# Patient Record
Sex: Female | Born: 2000 | Race: Black or African American | Hispanic: No
Health system: Southern US, Community
[De-identification: ages and names within clinical notes are randomized; demographics above are authoritative.]

## PROBLEM LIST (undated history)

## (undated) DIAGNOSIS — Q6119 Other polycystic kidney, infantile type: Secondary | ICD-10-CM

## (undated) DIAGNOSIS — N83209 Unspecified ovarian cyst, unspecified side: Secondary | ICD-10-CM

## (undated) DIAGNOSIS — N289 Disorder of kidney and ureter, unspecified: Secondary | ICD-10-CM

## (undated) HISTORY — DX: Disorder of kidney and ureter, unspecified: N28.9

---
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
## 2021-01-21 ENCOUNTER — Emergency Department (HOSPITAL_COMMUNITY): Payer: BC Managed Care – PPO

## 2021-01-21 ENCOUNTER — Telehealth: Payer: Self-pay | Admitting: *Deleted

## 2021-01-21 ENCOUNTER — Encounter (HOSPITAL_COMMUNITY): Payer: Self-pay | Admitting: Emergency Medicine

## 2021-01-21 ENCOUNTER — Other Ambulatory Visit: Payer: Self-pay

## 2021-01-21 ENCOUNTER — Emergency Department (HOSPITAL_COMMUNITY)
Admission: EM | Admit: 2021-01-21 | Discharge: 2021-01-21 | Disposition: A | Discharge status: Home / Self Care | Payer: BC Managed Care – PPO | Attending: Emergency Medicine | Admitting: Emergency Medicine

## 2021-01-21 DIAGNOSIS — D72829 Elevated white blood cell count, unspecified: Secondary | ICD-10-CM | POA: Diagnosis not present

## 2021-01-21 DIAGNOSIS — N10 Acute tubulo-interstitial nephritis: Secondary | ICD-10-CM | POA: Insufficient documentation

## 2021-01-21 DIAGNOSIS — Z20822 Contact with and (suspected) exposure to covid-19: Secondary | ICD-10-CM | POA: Insufficient documentation

## 2021-01-21 DIAGNOSIS — M549 Dorsalgia, unspecified: Secondary | ICD-10-CM | POA: Insufficient documentation

## 2021-01-21 DIAGNOSIS — R102 Pelvic and perineal pain: Secondary | ICD-10-CM

## 2021-01-21 DIAGNOSIS — Z91040 Latex allergy status: Secondary | ICD-10-CM | POA: Diagnosis not present

## 2021-01-21 DIAGNOSIS — N12 Tubulo-interstitial nephritis, not specified as acute or chronic: Secondary | ICD-10-CM

## 2021-01-21 DIAGNOSIS — R1031 Right lower quadrant pain: Secondary | ICD-10-CM | POA: Diagnosis present

## 2021-01-21 HISTORY — DX: Other polycystic kidney, infantile type: Q61.19

## 2021-01-21 HISTORY — DX: Unspecified ovarian cyst, unspecified side: N83.209

## 2021-01-21 LAB — CBC
HCT: 36.2 % (ref 36.0–46.0)
Hemoglobin: 12.1 g/dL (ref 12.0–15.0)
MCH: 29.7 pg (ref 26.0–34.0)
MCHC: 33.4 g/dL (ref 30.0–36.0)
MCV: 88.7 fL (ref 80.0–100.0)
Platelets: 261 10*3/uL (ref 150–400)
RBC: 4.08 MIL/uL (ref 3.87–5.11)
RDW: 13.4 % (ref 11.5–15.5)
WBC: 7.6 10*3/uL (ref 4.0–10.5)
nRBC: 0 % (ref 0.0–0.2)

## 2021-01-21 LAB — LIPASE, BLOOD: Lipase: 26 U/L (ref 11–51)

## 2021-01-21 LAB — I-STAT BETA HCG BLOOD, ED (MC, WL, AP ONLY): I-stat hCG, quantitative: <5 m[IU]/mL (ref <5)

## 2021-01-21 LAB — COMPREHENSIVE METABOLIC PANEL
ALT: 11 U/L (ref 0–44)
AST: 15 U/L (ref 15–41)
Albumin: 3.6 g/dL (ref 3.5–5.0)
Alkaline Phosphatase: 41 U/L (ref 38–126)
Anion gap: 10 (ref 5–15)
BUN: 8 mg/dL (ref 6–20)
CO2: 20 mmol/L — ABNORMAL LOW (ref 22–32)
Calcium: 8.8 mg/dL — ABNORMAL LOW (ref 8.9–10.3)
Chloride: 108 mmol/L (ref 98–111)
Creatinine, Ser: 0.82 mg/dL (ref 0.44–1.00)
GFR, Estimated: >60 mL/min (ref >60)
Glucose, Bld: 90 mg/dL (ref 70–99)
Potassium: 3.6 mmol/L (ref 3.5–5.1)
Sodium: 138 mmol/L (ref 135–145)
Total Bilirubin: 0.5 mg/dL (ref 0.3–1.2)
Total Protein: 6.3 g/dL — ABNORMAL LOW (ref 6.5–8.1)

## 2021-01-21 LAB — RESP PANEL BY RT-PCR (FLU A&B, COVID) ARPGX2
Influenza A by PCR: NEGATIVE
Influenza B by PCR: NEGATIVE
SARS Coronavirus 2 by RT PCR: NEGATIVE

## 2021-01-21 LAB — URINALYSIS, ROUTINE W REFLEX MICROSCOPIC
Bilirubin Urine: NEGATIVE
Glucose, UA: NEGATIVE mg/dL
Ketones, ur: NEGATIVE mg/dL
Nitrite: NEGATIVE
Protein, ur: NEGATIVE mg/dL
Specific Gravity, Urine: 1.017 (ref 1.005–1.030)
pH: 7 (ref 5.0–8.0)

## 2021-01-21 LAB — APTT: aPTT: 29 seconds (ref 24–36)

## 2021-01-21 LAB — LACTIC ACID, PLASMA: Lactic Acid, Venous: 0.8 mmol/L (ref 0.5–1.9)

## 2021-01-21 LAB — PROTIME-INR
INR: 1 (ref 0.8–1.2)
Prothrombin Time: 13.3 seconds (ref 11.4–15.2)

## 2021-01-21 MED ORDER — ONDANSETRON HCL 4 MG/2ML IJ SOLN
4.0000 mg | Freq: Once | INTRAMUSCULAR | Status: AC
  Administered 2021-01-21: 4 mg via INTRAVENOUS
  Filled 2021-01-21: qty 2

## 2021-01-21 MED ORDER — IOHEXOL 300 MG/ML  SOLN
100.0000 mL | Freq: Once | INTRAMUSCULAR | Status: AC | PRN
  Administered 2021-01-21: 100 mL via INTRAVENOUS

## 2021-01-21 MED ORDER — OXYCODONE-ACETAMINOPHEN 5-325 MG PO TABS
1.0000 | ORAL_TABLET | Freq: Once | ORAL | Status: AC
  Administered 2021-01-21: 1 via ORAL
  Filled 2021-01-21: qty 1

## 2021-01-21 MED ORDER — MORPHINE SULFATE (PF) 4 MG/ML IV SOLN
4.0000 mg | Freq: Once | INTRAVENOUS | Status: AC
Start: 2021-01-21 — End: 2021-01-21
  Administered 2021-01-21: 4 mg via INTRAVENOUS
  Filled 2021-01-21: qty 1

## 2021-01-21 MED ORDER — PHENAZOPYRIDINE HCL 200 MG PO TABS: 200.0000 mg | ORAL_TABLET | Freq: Three times a day (TID) | ORAL | 0 refills | Status: DC | PRN

## 2021-01-21 MED ORDER — CEPHALEXIN 500 MG PO CAPS: 500.0000 mg | ORAL_CAPSULE | Freq: Four times a day (QID) | ORAL | 0 refills | Status: AC

## 2021-01-21 MED ORDER — SODIUM CHLORIDE 0.9 % IV SOLN
1.0000 g | Freq: Once | INTRAVENOUS | Status: AC
  Administered 2021-01-21: 1 g via INTRAVENOUS
  Filled 2021-01-21: qty 10

## 2021-01-21 MED ORDER — LACTATED RINGERS IV BOLUS (SEPSIS)
1000.0000 mL | Freq: Once | INTRAVENOUS | Status: AC
  Administered 2021-01-21: 1000 mL via INTRAVENOUS

## 2021-01-21 MED ORDER — HYDROMORPHONE HCL 1 MG/ML IJ SOLN
1.0000 mg | Freq: Once | INTRAMUSCULAR | Status: AC
  Administered 2021-01-21: 1 mg via INTRAVENOUS
  Filled 2021-01-21: qty 1

## 2021-01-21 NOTE — Discharge Instructions (Addendum)
1. Medications: Keflex, pyridium, usual home medications 2. Treatment: rest, drink plenty of fluids, take medications as prescribed 3. Follow Up: Please followup with your primary doctor in 3 days for discussion of your diagnoses and further evaluation after today's visit; if you do not have a primary care doctor use the resource guide provided to find one; return to the ER for fevers, persistent vomiting, worsening abdominal pain or other concerning symptoms.

## 2021-01-21 NOTE — ED Provider Notes (Signed)
Patient's care turned over to me Theodoro Grist PA at 6:30 AM.  Patient is awaiting ultrasound to rule out ovarian torsion.  Patient has received antibiotics for UTI/early pyelonephritis.  Ultrasound returned and shows normal blood flow no evidence of ovarian torsion.  Patient is reevaluated.  Patient advised of results and follow-up.   Fransico Meadow, PA-C 01/21/21 0740    Gareth Morgan, MD 01/22/21 [phone]

## 2021-01-21 NOTE — Telephone Encounter (Addendum)
TOC CM received call from pt stating her pharmacy did not received escript. TOC CM contacted [facility] and called in Rx. Contacted pt to make her aware. [place], [facility] TOC CM [phone]

## 2021-01-21 NOTE — ED Triage Notes (Signed)
Patient reports bilateral lower back pain/hypogastric pain onset this week , occasional emesis , denies diarrhea or fever , history of ovarian cyst.

## 2021-01-21 NOTE — ED Provider Notes (Signed)
[facility] Provider Note   CSN: [id number] Arrival date & time: 01/21/21  0002     History Chief Complaint  Patient presents with  . Back Pain  . Abdominal Pain    Heather Goodman is a 20 y.o. female presents to the Emergency Department complaining of gradual, persistent, progressively worsening lower quadrant and right flank pain onset around 10:30 PM.  Patient reports she has had some intermittent mild lower abdominal pain over the last week but nothing like the current pain.  She reports pain tonight is severe, 10/10.  Patient reports no previous abdominal surgeries.  Denies urinary and vaginal symptoms.  Denies possibility of pregnancy.  Patient reports the pain is stabbing in nature.  Movement and palpation make it worse.  Nothing makes it better.  She has not taken any medications prior to arrival.  Patient's last oral intake was [employer] at 10 PM tonight.  Denies fever, chills, headache neck pain, chest pain, shortness of breath, cough, congestion, diarrhea, weakness, dizziness, syncope.  Patient does report one episode of vomiting.  Emesis was nonbloody nonbilious.  The history is provided by the patient, medical records and a friend. No language interpreter was used.       Past Medical History:  Diagnosis Date  . Ovarian cyst   . Polycystic kidney disease, childhood type (CPKD)     There are no problems to display for this patient.   History reviewed. No pertinent surgical history.   OB History   No obstetric history on file.     No family history on file.  Social History   Tobacco Use  . Smoking status: Never Smoker  . Smokeless tobacco: Never Used  Substance Use Topics  . Alcohol use: Never  . Drug use: Never    Home Medications Prior to Admission medications   Medication Sig Start Date End Date Taking? Authorizing Provider  acetaminophen (TYLENOL) 325 MG tablet Take 650 mg by mouth every 6 (six) hours as  needed for moderate pain or headache. 01/05/15  Yes [provider]  cephALEXin (KEFLEX) 500 MG capsule Take 1 capsule (500 mg total) by mouth 4 (four) times daily for 7 days. 01/21/21 01/28/21 Yes Hiilei Gerst, Jarrett Soho, PA-C  ferrous sulfate 325 (65 FE) MG tablet Take 325 mg by mouth daily as needed (low iron).   Yes [provider]  levonorgestrel (MIRENA, 52 MG,) 20 MCG/24HR IUD 1 Intra Uterine Device by Intrauterine route. 05/09/19  Yes [provider]  Multiple Vitamins-Minerals (ONE-A-DAY WOMENS) tablet Take 1 tablet by mouth daily.   Yes [provider]  phenazopyridine (PYRIDIUM) 200 MG tablet Take 1 tablet (200 mg total) by mouth 3 (three) times daily as needed for pain. 01/21/21  Yes Lynley Killilea, Jarrett Soho, PA-C  Vitamin D, Ergocalciferol, (DRISDOL) 1.25 MG (50000 UNIT) CAPS capsule Take 50,000 Units by mouth every Wednesday. 09/02/20   [provider]    Allergies    Aspirin, Bergera koenigii (curry tree) [bergera koenigii], and Latex  Review of Systems   Review of Systems  Constitutional: Negative for appetite change, diaphoresis, fatigue, fever and unexpected weight change.  HENT: Negative for mouth sores.   Eyes: Negative for visual disturbance.  Respiratory: Negative for cough, chest tightness, shortness of breath and wheezing.   Cardiovascular: Negative for chest pain.  Gastrointestinal: Positive for abdominal pain, nausea and vomiting. Negative for constipation and diarrhea.  Endocrine: Negative for polydipsia, polyphagia and polyuria.  Genitourinary: Negative for dysuria, frequency, hematuria and urgency.  Musculoskeletal: Positive for back pain. Negative for neck stiffness.  Skin: Negative for rash.  Allergic/Immunologic: Negative for immunocompromised state.  Neurological: Negative for syncope, light-headedness and headaches.  Hematological: Does not bruise/bleed easily.  Psychiatric/Behavioral: Negative for sleep disturbance. The  patient is not nervous/anxious.     Physical Exam Updated Vital Signs BP (!) 168/110 (BP Location: Left Arm)   Pulse 84   Temp 98 F (36.7 C) (Oral)   Resp (!) 26   Ht 5\' 8"  (1.727 m)   Wt 110 kg   LMP 12/31/2020   SpO2 100%   BMI 36.87 kg/m   Physical Exam Vitals and nursing note reviewed.  Constitutional:      General: She is not in acute distress.    Appearance: She is not diaphoretic.  HENT:     Head: Normocephalic.  Eyes:     General: No scleral icterus.    Conjunctiva/sclera: Conjunctivae normal.  Cardiovascular:     Rate and Rhythm: Normal rate and regular rhythm.     Pulses: Normal pulses.          Radial pulses are 2+ on the right side and 2+ on the left side.  Pulmonary:     Effort: Tachypnea present. No accessory muscle usage, prolonged expiration, respiratory distress or retractions.     Breath sounds: No stridor.     Comments: Equal chest rise. No increased work of breathing. Abdominal:     General: There is no distension.     Palpations: Abdomen is soft.     Tenderness: There is abdominal tenderness in the right lower quadrant and periumbilical area. There is right CVA tenderness, guarding and rebound. Positive signs include McBurney's sign. Negative signs include Murphy's sign.     Comments: With exquisite tenderness to the right lower quadrant with rebound and guarding.  Musculoskeletal:     Cervical back: Normal range of motion.     Comments: Moves all extremities equally and without difficulty.  Skin:    General: Skin is warm and dry.     Capillary Refill: Capillary refill takes less than 2 seconds.     Comments: Hot to touch  Neurological:     Mental Status: She is alert.     GCS: GCS eye subscore is 4. GCS verbal subscore is 5. GCS motor subscore is 6.     Comments: Speech is clear and goal oriented.  Psychiatric:        Mood and Affect: Mood normal.     ED Results / Procedures / Treatments   Labs (all labs ordered are listed, but only  abnormal results are displayed) Labs Reviewed  COMPREHENSIVE METABOLIC PANEL - Abnormal; Notable for the following components:      Result Value   CO2 20 (*)    Calcium 8.8 (*)    Total Protein 6.3 (*)    All other components within normal limits  URINALYSIS, ROUTINE W REFLEX MICROSCOPIC - Abnormal; Notable for the following components:   APPearance HAZY (*)    Hgb urine dipstick MODERATE (*)    Leukocytes,Ua LARGE (*)    Bacteria, UA RARE (*)    All other components within normal limits  RESP PANEL BY RT-PCR (FLU A&B, COVID) ARPGX2  CULTURE, BLOOD (SINGLE)  URINE CULTURE  LIPASE, BLOOD  CBC  LACTIC ACID, PLASMA  PROTIME-INR  APTT  I-STAT BETA HCG BLOOD, ED ([facility], [facility], [facility] ONLY)    EKG EKG Interpretation  Date/Time:  Monday January 21 2021 01:02:06 EDT Ventricular  Rate:  74 PR Interval:  154 QRS Duration: 90 QT Interval:  404 QTC Calculation: 449 R Axis:   67 Text Interpretation: Sinus rhythm Normal ECG No old tracing to compare Confirmed by Calvert Cantor [phone]) on 01/21/2021 1:13:15 AM   Radiology CT ABDOMEN PELVIS W CONTRAST  Result Date: 01/21/2021 CLINICAL DATA:  Bilateral lower back pain EXAM: CT ABDOMEN AND PELVIS WITH CONTRAST TECHNIQUE: Multidetector CT imaging of the abdomen and pelvis was performed using the standard protocol following bolus administration of intravenous contrast. CONTRAST:  140mL OMNIPAQUE IOHEXOL 300 MG/ML  SOLN COMPARISON:  None. FINDINGS: Lower chest:  Mild atelectasis at the left lung base. Hepatobiliary: Few small scattered cysts with simple appearance in the liver. No evidence of biliary obstruction or stone. Pancreas: Unremarkable. Spleen: Unremarkable. Adrenals/Urinary Tract: Negative adrenals. Numerous bilateral renal cysts with simple appearance. A lobulated cyst or thinly septated cyst in the right kidney measures up to 3.7 cm on axial slices. No hydronephrosis or ureteral calculus. Stomach/Bowel:  No obstruction. No appendicitis.  Vascular/Lymphatic: No acute vascular abnormality. No mass or adenopathy. Reproductive:IUD in place. Other: Trace pelvic fluid which could be physiologic. Musculoskeletal: No acute abnormalities. IMPRESSION: 1. Small volume pelvic fluid which could be physiologic. 2. Polycystic kidneys. Electronically Signed   By: Monte Fantasia M.D.   On: 01/21/2021 04:32   DG Chest Port 1 View  Result Date: 01/21/2021 CLINICAL DATA:  Questionable sepsis EXAM: PORTABLE CHEST 1 VIEW COMPARISON:  None. FINDINGS: The heart size and mediastinal contours are within normal limits. Both lungs are clear. The visualized skeletal structures are unremarkable. IMPRESSION: Negative. Electronically Signed   By: Rolm Baptise M.D.   On: 01/21/2021 01:09    Procedures Procedures   Medications Ordered in ED Medications  cefTRIAXone (ROCEPHIN) 1 g in sodium chloride 0.9 % 100 mL IVPB (has no administration in time range)  oxyCODONE-acetaminophen (PERCOCET/ROXICET) 5-325 MG per tablet 1 tablet (1 tablet Oral Given 01/21/21 0013)  lactated ringers bolus 1,000 mL (0 mLs Intravenous Stopped 01/21/21 0248)  morphine 4 MG/ML injection 4 mg (4 mg Intravenous Given 01/21/21 0140)  ondansetron (ZOFRAN) injection 4 mg (4 mg Intravenous Given 01/21/21 0125)  HYDROmorphone (DILAUDID) injection 1 mg (1 mg Intravenous Given 01/21/21 0525)  iohexol (OMNIPAQUE) 300 MG/ML solution 100 mL (100 mLs Intravenous Contrast Given 01/21/21 0424)    ED Course  I have reviewed the triage vital signs and the nursing notes.  Pertinent labs & imaging results that were available during my care of the patient were reviewed by me and considered in my medical decision making (see chart for details).  Clinical Course as of 01/21/21 0639  Mon Jan 21, 2021  0345 I-stat hCG, quantitative: <5.0 Pregnancy test negative.  Pain is not secondary to ruptured ectopic. [HM]  0345 Leukocytes,Ua(!): LARGE Hemoglobin, leukocytes and white blood cells in patient's urine.   Question UTI/pyelonephritis however patient denies urinary symptoms.  CT scan pending. [HM]  0345 SARS Coronavirus 2 by RT PCR: NEGATIVE Negative [HM]  0345 WBC: 7.6 No leukocytosis [HM]  0617 Lactic Acid, Venous: 0.8 WNL [HM]    Clinical Course User Index [HM] Sumayya Muha, Gwenlyn Perking   MDM Rules/Calculators/A&P                           Patient presents with severe abdominal pain.  Hot to touch and I suspect febrile.  Concern for ovarian torsion, ectopic pregnancy, appendicitis, colitis, bowel obstruction, nephrolithiasis/renal colic.  Sepsis screen initiated.  5:45 AM Work-up generally reassuring.  Evidence of UTI.  Rocephin given.  CT scan without acute abnormality.  Will obtain UA to r/o torsion.  Will treat for clinical pyelo.  6:39 AM At shift change care was transferred to [facility] who will follow pending studies, re-evaulate and determine disposition.    Final Clinical Impression(s) / ED Diagnoses Final diagnoses:  Pyelonephritis    Rx / DC Orders ED Discharge Orders         Ordered    cephALEXin (KEFLEX) 500 MG capsule  4 times daily        01/21/21 0619    phenazopyridine (PYRIDIUM) 200 MG tablet  3 times daily PRN        01/21/21 0619           Elodia Haviland, Jarrett Soho, PA-C 01/21/21 6431    Truddie Hidden, MD 01/21/21 1311

## 2021-01-22 LAB — URINE CULTURE

## 2021-01-26 LAB — CULTURE, BLOOD (SINGLE)
Culture: NO GROWTH
Special Requests: ADEQUATE

## 2021-07-16 ENCOUNTER — Ambulatory Visit (INDEPENDENT_AMBULATORY_CARE_PROVIDER_SITE_OTHER): Payer: BC Managed Care – PPO | Admitting: Family

## 2021-07-16 ENCOUNTER — Encounter (INDEPENDENT_AMBULATORY_CARE_PROVIDER_SITE_OTHER): Payer: Self-pay

## 2021-07-16 VITALS — BP 127/64 | HR 81 | Temp 98.0°F | Resp 16 | Ht 66.0 in | Wt 180.0 lb

## 2021-07-16 DIAGNOSIS — W5501XA Bitten by cat, initial encounter: Secondary | ICD-10-CM

## 2021-07-16 DIAGNOSIS — S61452A Open bite of left hand, initial encounter: Secondary | ICD-10-CM

## 2021-07-16 MED ORDER — METRONIDAZOLE 500 MG PO TABS
500.0000 mg | ORAL_TABLET | Freq: Three times a day (TID) | ORAL | 0 refills | Status: AC
Start: 2021-07-16 — End: 2021-07-23

## 2021-07-16 MED ORDER — DOXYCYCLINE HYCLATE 100 MG PO TABS
100.0000 mg | ORAL_TABLET | Freq: Two times a day (BID) | ORAL | 0 refills | Status: AC
Start: 2021-07-16 — End: 2021-07-23

## 2021-07-16 MED ORDER — MUPIROCIN 2 % EX OINT
TOPICAL_OINTMENT | Freq: Two times a day (BID) | CUTANEOUS | 0 refills | Status: AC
Start: 2021-07-16 — End: 2021-07-23

## 2021-07-16 NOTE — Progress Notes (Signed)
Subjective:    Patient ID: Zoe Lewis is a 20 y.o. female.    This provider utilized all appropriate PPE equipment during this visit Gloves, N95, patient wearing face mask.  All provider tools were cleaned with disinfectant solution before and after use on this patient.     HPI Patient reports for cat bite on left hand which happened last night. Patient reports it her left hand was bitten by her cat that was having a seizure. Patient Tdap was last given in 2014.     The following portions of the patient's history were reviewed and updated as appropriate: allergies, current medications, past family history, past medical history, past social history, past surgical history, and problem list.    Review of Systems   Constitutional:  Negative for chills.   Gastrointestinal:  Negative for abdominal pain.   Musculoskeletal:  Negative for joint swelling.   Skin:  Positive for wound. Negative for color change.        Left hand puncture wounds       Objective:    BP 127/64   Pulse 81   Temp 98 F (36.7 C) (Oral)   Resp 16   Ht 1.676 m (5\' 6" )   Wt 81.6 kg (180 lb)   LMP  (LMP Unknown)   BMI 29.05 kg/m     Physical Exam  Vitals reviewed.   Constitutional:       Appearance: She is not ill-appearing.   Cardiovascular:      Rate and Rhythm: Normal rate.      Pulses: Normal pulses.   Pulmonary:      Effort: Pulmonary effort is normal.   Abdominal:      Palpations: Abdomen is soft.   Skin:     Findings: Erythema present.      Comments: Puncture wounds of the left hand    Neurological:      Mental Status: She is alert and oriented to person, place, and time.   Psychiatric:         Mood and Affect: Mood normal.           Assessment and Plan:       Zoe Lewis was seen today for animal bite.    Diagnoses and all orders for this visit:    Cat bite, initial encounter  -     doxycycline (VIBRA-TABS) 100 MG tablet; Take 1 tablet (100 mg total) by mouth 2 (two) times daily for 7 days  -     metroNIDAZOLE (Flagyl) 500 MG tablet; Take 1  tablet (500 mg total) by mouth 3 (three) times daily for 7 days  -     mupirocin (BACTROBAN) 2 % ointment; Apply topically 2 (two) times daily for 7 days          Likely cat bite. Reports allergy to Amoxicillin. Will start on Doxy 100mg  BID and Flagyl 500mg  TID -Bactroban BID and PRN. Reports TDAP is up to date. Animal paper work completed.   -Cleans with soap and water.  -Watch for increase pain, redness, swelling, streaking, fever, nausea, vomiting, diarrhea. RTC or go to the ED if this occurs. The patient was encouraged to read all handouts and patient instructions.     Vital signs noted no acute management indicated at this time. The patient was instructed to follow up with their primary care provider. The patient was also instructed to seek medical advice if they experience worsening symptoms and to follow up in 2-3 days if no improvement  in symptoms.     The patient was instructed to keep all current healthcare appointments. At the conclusion of the visit we reviewed diagnosis, treatment plan, prescriptions and follow up instructions pertaining to this visit. All patient questions and concerns regarding the current condition were addressed.    Gustavus Messing, FNP  [facility]  07/16/2021  2:43 PM

## 2022-03-08 ENCOUNTER — Emergency Department
Admission: EM | Admit: 2022-03-08 | Discharge: 2022-03-08 | Disposition: A | Discharge status: Home / Self Care | Payer: BC Managed Care – PPO | Attending: Emergency Medicine | Admitting: Emergency Medicine

## 2022-03-08 ENCOUNTER — Emergency Department: Payer: BC Managed Care – PPO

## 2022-03-08 DIAGNOSIS — K649 Unspecified hemorrhoids: Secondary | ICD-10-CM | POA: Insufficient documentation

## 2022-03-08 DIAGNOSIS — N938 Other specified abnormal uterine and vaginal bleeding: Secondary | ICD-10-CM | POA: Insufficient documentation

## 2022-03-08 DIAGNOSIS — Z5321 Procedure and treatment not carried out due to patient leaving prior to being seen by health care provider: Secondary | ICD-10-CM | POA: Insufficient documentation

## 2022-03-08 HISTORY — DX: Disorder of kidney and ureter, unspecified: N28.9

## 2022-03-08 LAB — CBC AND DIFFERENTIAL
Absolute NRBC: 0 10*3/uL (ref 0.00–0.00)
Basophils Absolute Automated: 0.06 10*3/uL (ref 0.00–0.08)
Basophils Automated: 0.7 %
Eosinophils Absolute Automated: 0.3 10*3/uL (ref 0.00–0.44)
Eosinophils Automated: 3.7 %
Hematocrit: 33.6 % — ABNORMAL LOW (ref 34.7–43.7)
Hgb: 10.9 g/dL — ABNORMAL LOW (ref 11.4–14.8)
Immature Granulocytes Absolute: 0.01 10*3/uL (ref 0.00–0.07)
Immature Granulocytes: 0.1 %
Instrument Absolute Neutrophil Count: 4.55 10*3/uL (ref 1.10–6.33)
Lymphocytes Absolute Automated: 2.74 10*3/uL (ref 0.42–3.22)
Lymphocytes Automated: 33.7 %
MCH: 26.3 pg (ref 25.1–33.5)
MCHC: 32.4 g/dL (ref 31.5–35.8)
MCV: 81 fL (ref 78.0–96.0)
MPV: 10.7 fL (ref 8.9–12.5)
Monocytes Absolute Automated: 0.48 10*3/uL (ref 0.21–0.85)
Monocytes: 5.9 %
Neutrophils Absolute: 4.55 10*3/uL (ref 1.10–6.33)
Neutrophils: 55.9 %
Nucleated RBC: 0 /100 WBC (ref 0.0–0.0)
Platelets: 329 10*3/uL (ref 142–346)
RBC: 4.15 10*6/uL (ref 3.90–5.10)
RDW: 15 % (ref 11–15)
WBC: 8.14 10*3/uL (ref 3.10–9.50)

## 2022-03-08 LAB — COMPREHENSIVE METABOLIC PANEL
ALT: 11 U/L (ref 0–55)
AST (SGOT): 14 U/L (ref 5–41)
Albumin/Globulin Ratio: 1.4 (ref 0.9–2.2)
Albumin: 4.4 g/dL (ref 3.5–5.0)
Alkaline Phosphatase: 49 U/L (ref 37–117)
Anion Gap: 8 (ref 5.0–15.0)
BUN: 10 mg/dL (ref 7.0–21.0)
Bilirubin, Total: 0.3 mg/dL (ref 0.2–1.2)
CO2: 21 mEq/L (ref 17–29)
Calcium: 9.4 mg/dL (ref 8.5–10.5)
Chloride: 110 mEq/L (ref 99–111)
Creatinine: 1 mg/dL (ref 0.4–1.0)
Globulin: 3.2 g/dL (ref 2.0–3.6)
Glucose: 91 mg/dL (ref 70–100)
Potassium: 3.8 mEq/L (ref 3.5–5.3)
Protein, Total: 7.6 g/dL (ref 6.0–8.3)
Sodium: 139 mEq/L (ref 135–145)
eGFR: >60 mL/min/{1.73_m2} (ref >=60)

## 2022-03-08 LAB — HCG, SERUM, QUALITATIVE: Hcg Qualitative: NEGATIVE

## 2022-03-08 LAB — URINALYSIS REFLEX TO MICROSCOPIC EXAM - REFLEX TO CULTURE
Bilirubin, UA: NEGATIVE
Glucose, UA: NEGATIVE
Ketones UA: 5 — AB
Nitrite, UA: NEGATIVE
Protein, UR: 30 — AB
Specific Gravity UA: 1.023 (ref 1.001–1.035)
Urine pH: 6 (ref 5.0–8.0)
Urobilinogen, UA: NEGATIVE mg/dL (ref 0.2–2.0)

## 2022-03-08 LAB — LIPASE: Lipase: 9 U/L (ref 8–78)

## 2022-03-08 MED ORDER — IBUPROFEN 800 MG PO TABS
800.0000 mg | ORAL_TABLET | Freq: Three times a day (TID) | ORAL | 0 refills | Status: DC | PRN
Start: 2022-03-08 — End: 2022-03-17

## 2022-03-08 MED ORDER — KETOROLAC TROMETHAMINE 30 MG/ML IJ SOLN
30.0000 mg | Freq: Once | INTRAMUSCULAR | Status: AC
Start: 2022-03-08 — End: 2022-03-08
  Administered 2022-03-08: 30 mg via INTRAVENOUS
  Filled 2022-03-08: qty 1

## 2022-03-08 MED ORDER — HYDROCORTISONE 2.5 % EX CREA
TOPICAL_CREAM | Freq: Two times a day (BID) | CUTANEOUS | 0 refills | Status: DC
Start: 2022-03-08 — End: 2022-03-17

## 2022-03-08 NOTE — ED Provider Notes (Signed)
EMERGENCY DEPARTMENT NOTE     Patient initially seen and examined at   ED PHYSICIAN ASSIGNED       Date/Time Event User Comments    03/08/22 0227 Physician Assigned Remer Macho. Remer Macho, DO assigned as Attending           ED MIDLEVEL (APP) ASSIGNED       None            HISTORY OF PRESENT ILLNESS   Independent Historian:No  Translator Used: mvtranslator: no    Chief Complaint: Vaginal Bleeding, Rectal Bleeding, and Abdominal Pain       21 y.o. female presents for evaluation of rectal and vaginal bleeding.  She states her last menstrual period was a month ago.  She then had some spotting and then yesterday began with increased vaginal bleeding no clots.  She reports some suprapubic discomfort.  She also has some rectal pain and some blood when she wipes.  She reports some lower back pain.  Questionable syncopal episode yesterday.      MEDICAL HISTORY     Past Medical History:  Past Medical History:   Diagnosis Date    Kidney disorder        Past Surgical History:  History reviewed. No pertinent surgical history.    Social History:  Social History     Socioeconomic History    Marital status: Unknown   Tobacco Use    Smoking status: Never    Smokeless tobacco: Never   Vaping Use    Vaping status: Never Used   Substance and Sexual Activity    Alcohol use: Yes     Comment: socially    Drug use: Never       Family History:  History reviewed. No pertinent family history.    Outpatient Medication:  Discharge Medication List as of 03/08/2022  8:18 AM        CONTINUE these medications which have NOT CHANGED    Details   buPROPion XL (WELLBUTRIN XL) 150 MG 24 hr tablet Take 150 mg by mouth every morning, Starting Wed 06/19/2021, Historical Med      traZODone (DESYREL) 50 MG tablet Starting Wed 06/12/2021, Historical Med               REVIEW OF SYSTEMS   Review of Systems   Constitutional: Negative.    HENT: Negative.     Respiratory: Negative.     Cardiovascular: Negative.    Musculoskeletal: Negative.         PHYSICAL EXAM     ED Triage Vitals [03/08/22 0200]   Enc Vitals Group      BP 121/87      Heart Rate 83      Resp Rate 16      Temp 98.5 F (36.9 C)      Temp Source Oral      SpO2 98 %      Weight 104.3 kg      Height 1.727 m      Head Circumference       Peak Flow       Pain Score 6      Pain Loc       Pain Edu?       Excl. in [place]?        CONSTITUTIONAL:  No acute distress  EYES:  no scleral icterus  HEAD:  atraumatic  ENT:  mucus membranes moist  NECK:  supple, trachea midline  CARDIOVASCULAR:  regular rate  PULMONARY/CHEST: no evidence of respiratory distress  ABDOMEN:  soft, non-tender  MUSCULOSKELETAL:  no swelling, tenderness or deformity  SKIN:  warm and dry  NEURO:  AOX4  PSYCH:  appropriate behavior    MEDICAL DECISION MAKING     PRIMARY PROBLEM LIST             Acute illness/injury with risk to life or bodily function (based on differential diagnosis or evaluation) DIAGNOSIS:uterine bleeding, hemorrhoids  Chronic Illness Impacting Care of the above problem: N/A N/A  Differential Diagnosis (included, but not limited to: Abdominal Pain: bowel obstruction, abscess, colitis, diverticulitis, appendicitis, peritonitis, bowel perforation, mesenteric ischemia, abdominal aortic aneurysm, cholecystitis, biliay colic, cholangitis     DISCUSSION            If patient is being hospitalized is severe sepsis or septic shock suspected?: N/A      Was management discussed with a consultant?: N/A    Was the decision around the need for surgery discussed with consultant: N/A    External Records Reviewed?: [place] Prescription Drug Monitor Reviewed, and no concerning prescriptions noted.    Diagnostic test considered and not performed: Other (please explain)CT A/P not indicated    Prescription medications considered and not given: N/A    N/A    Social Determinants of Health Considerations: N/A    Was there decision to not resuscitate or to de-escalate care due to poor prognosis?: N/A           Vital Signs: Reviewed the  patient's vital signs.   Nursing Notes: Reviewed and utilized available nursing notes.  Medical Records Reviewed: Reviewed available past medical records.  Counseling: The emergency provider has spoken with the patient and discussed today's findings, in addition to providing specific details for the plan of care.  Questions are answered and there is agreement with the plan.      MIPS DOCUMENTATION                CARDIAC STUDIES      EMERGENCY IMAGING STUDIES      RADIOLOGY IMAGING STUDIES      US Transvaginal Only Non-OB   Final Result         1. The intrauterine device is displaced into the endocervical canal.      2. Mildly heterogeneous material within the endometrial canal likely reflects blood products.      Earney Navy, MD   03/08/2022 6:45 AM          EMERGENCY DEPT. MEDICATIONS      ED Medication Orders (From admission, onward)      Start Ordered     Status Ordering Provider    03/08/22 [phone] 03/08/22 0345  ketorolac (TORADOL) injection 30 mg  Once        Route: Intravenous  Ordered Dose: 30 mg       Last MAR action: Given Terecia Plaut, [place]    Ordered and independently interpreted AVAILABLE laboratory tests.   Results       Procedure Component Value Units Date/Time    Comprehensive metabolic panel [id number] Collected: 03/08/22 0244    Specimen: Blood Updated: 03/08/22 0338     Glucose 91 mg/dL      BUN 10.0 mg/dL      Creatinine 1.0 mg/dL      Sodium 139 mEq/L      Potassium 3.8 mEq/L      Chloride  110 mEq/L      CO2 21 mEq/L      Calcium 9.4 mg/dL      Protein, Total 7.6 g/dL      Albumin 4.4 g/dL      AST (SGOT) 14 U/L      ALT 11 U/L      Alkaline Phosphatase 49 U/L      Bilirubin, Total 0.3 mg/dL      Globulin 3.2 g/dL      Albumin/Globulin Ratio 1.4     Anion Gap 8.0     eGFR >60.0 mL/min/1.73 m2     Lipase [id number] Collected: 03/08/22 0244    Specimen: Blood Updated: 03/08/22 0338     Lipase 9 U/L     Beta HCG, Qual, Serum [id number] Collected: 03/08/22 0244    Specimen:  Blood Updated: 03/08/22 0328     Hcg Qualitative Negative    Urinalysis Reflex to Microscopic Exam- Reflex to Culture [id number]  (Abnormal) Collected: 03/08/22 0244     Updated: 03/08/22 0310     Urine Type Urine, Clean Ca     Color, UA Yellow     Clarity, UA Sl Cloudy     Specific Gravity UA 1.023     Urine pH 6.0     Leukocyte Esterase, UA Moderate     Nitrite, UA Negative     Protein, UR 30     Glucose, UA Negative     Ketones UA 5     Urobilinogen, UA Negative mg/dL      Bilirubin, UA Negative     Blood, UA Large     RBC, UA TNTC /hpf      WBC, UA 11 - 25 /hpf      Squamous Epithelial Cells, Urine 0 - 5 /hpf      Urine Mucus Present    CBC and differential [id number]  (Abnormal) Collected: 03/08/22 0244    Specimen: Blood Updated: 03/08/22 0302     WBC 8.14 x10 3/uL      Hgb 10.9 g/dL      Hematocrit 33.6 %      Platelets 329 x10 3/uL      RBC 4.15 x10 6/uL      MCV 81.0 fL      MCH 26.3 pg      MCHC 32.4 g/dL      RDW 15 %      MPV 10.7 fL      Instrument Absolute Neutrophil Count 4.55 x10 3/uL      Neutrophils 55.9 %      Lymphocytes Automated 33.7 %      Monocytes 5.9 %      Eosinophils Automated 3.7 %      Basophils Automated 0.7 %      Immature Granulocytes 0.1 %      Nucleated RBC 0.0 /100 WBC      Neutrophils Absolute 4.55 x10 3/uL      Lymphocytes Absolute Automated 2.74 x10 3/uL      Monocytes Absolute Automated 0.48 x10 3/uL      Eosinophils Absolute Automated 0.30 x10 3/uL      Basophils Absolute Automated 0.06 x10 3/uL      Immature Granulocytes Absolute 0.01 x10 3/uL      Absolute NRBC 0.00 x10 3/uL               CRITICAL CARE/PROCEDURES    Procedures    DIAGNOSIS      Diagnosis:  Final diagnoses:  Hemorrhoids, unspecified hemorrhoid type   DUB (dysfunctional uterine bleeding)       Disposition:  ED Disposition       ED Disposition   Discharge    Condition   --    Date/Time   Sat Mar 08, 2022  7:46 AM    Comment   Oneita Hurt discharge to home/self care.    Condition at disposition: Stable                  Prescriptions:  Discharge Medication List as of 03/08/2022  8:18 AM        START taking these medications    Details   hydrocortisone 2.5 % cream Apply topically 2 (two) times daily for 7 days, Starting Sat 03/08/2022, Until Sat 03/15/2022, E-Rx      ibuprofen (ADVIL) 800 MG tablet Take 1 tablet (800 mg) by mouth every 8 (eight) hours as needed for Pain or Fever, Starting Sat 03/08/2022, E-Rx           CONTINUE these medications which have NOT CHANGED    Details   buPROPion XL (WELLBUTRIN XL) 150 MG 24 hr tablet Take 150 mg by mouth every morning, Starting Wed 06/19/2021, Historical Med      traZODone (DESYREL) 50 MG tablet Starting Wed 06/12/2021, Historical Med                  Remer Macho, [place]  03/08/22 2121

## 2022-03-08 NOTE — ED Triage Notes (Signed)
Wheelchaired to triage. C/o "lower ab/back pain", rectal and vaginal bleeding, with headaches." Symptoms started about month ago with just spotting.  Pain 6/10. Endorsing syncopal episode yesterday, SOB. Denies urinary symptoms.

## 2022-03-08 NOTE — ED Notes (Signed)
Patient's ultrasound results returned with the displaced IUD.  No acute pathology to be concerned about patient's symptoms.  Patient was discharged home.     Dellie Catholic, MD  03/08/22 [phone]

## 2022-06-24 IMAGING — CT CT ABD-PELV W/ CM
2 of 4 series · 17 of 46 positions shown, 19 images · IV contrast (omnipaque)
Comparison: None.

CLINICAL DATA: Bilateral lower back pain

EXAM:
CT ABDOMEN AND PELVIS WITH CONTRAST
TECHNIQUE: Multidetector CT imaging of the abdomen and pelvis was performed
using the standard protocol following bolus administration of
intravenous contrast.
CONTRAST:  100mL OMNIPAQUE IOHEXOL 300 MG/ML  SOLN

[Series 3: abdomen 5.0 · axial · 0.75mm/px · z∈[-434,-24]mm · 14 of 94 slices shown, 16 images]
[im 6/94  soft-tissue]
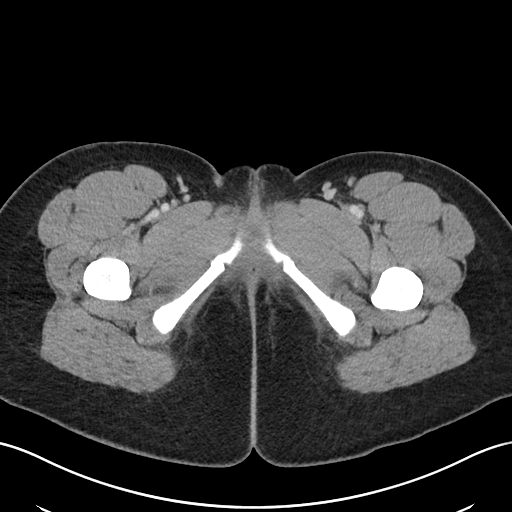
[im 6/94  bone]
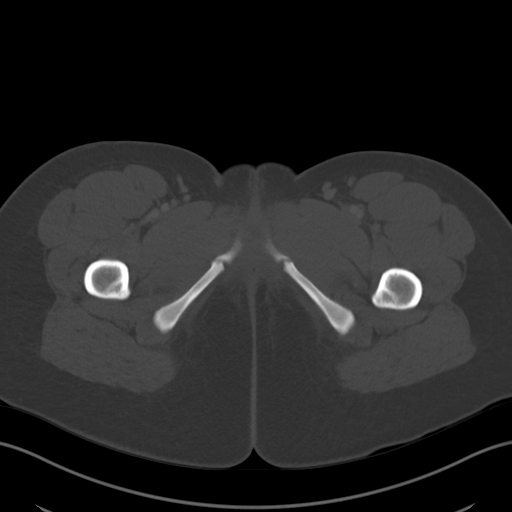
[im 11/94  soft-tissue]
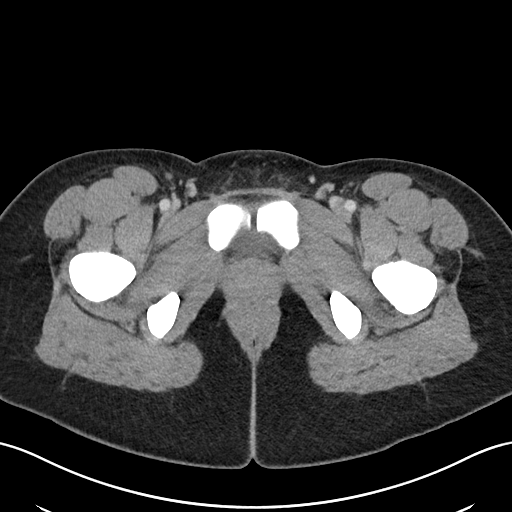
[im 21/94  soft-tissue]
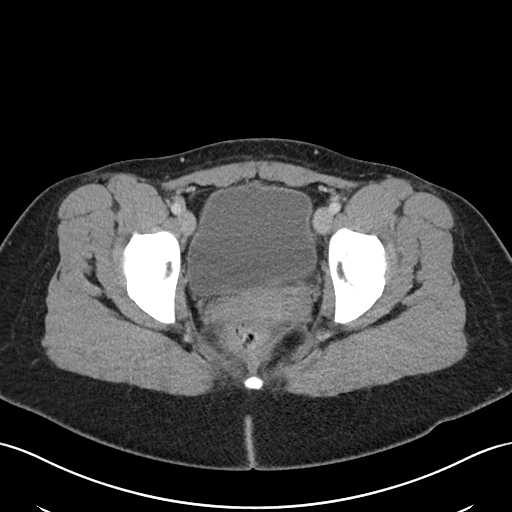
[im 26/94  soft-tissue]
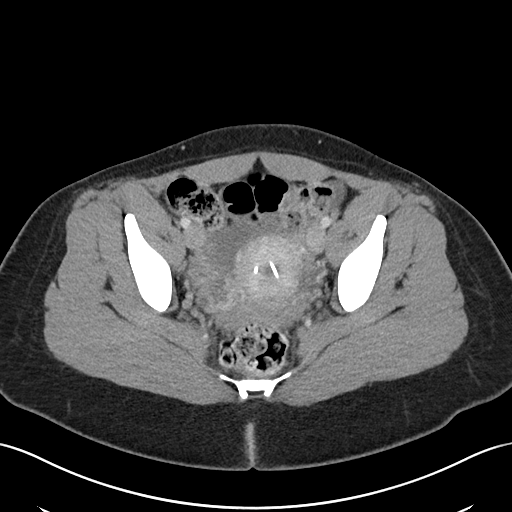
[im 32/94  soft-tissue]
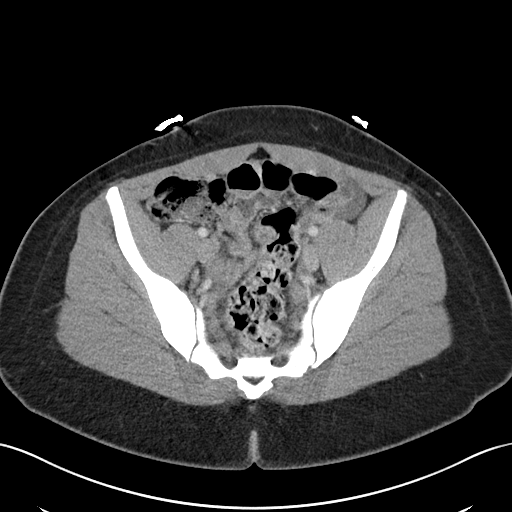
[im 37/94  soft-tissue]
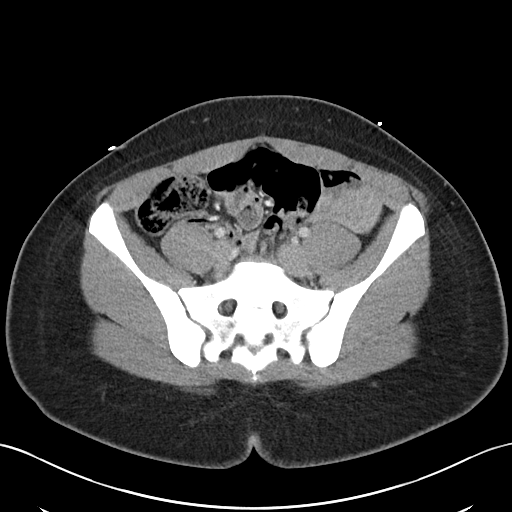
[im 42/94  soft-tissue]
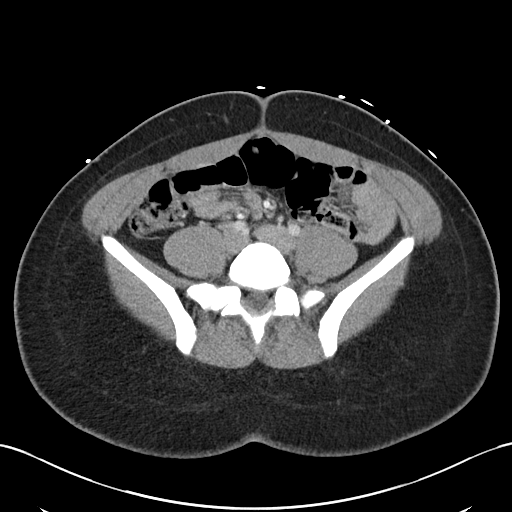
[im 52/94  soft-tissue]
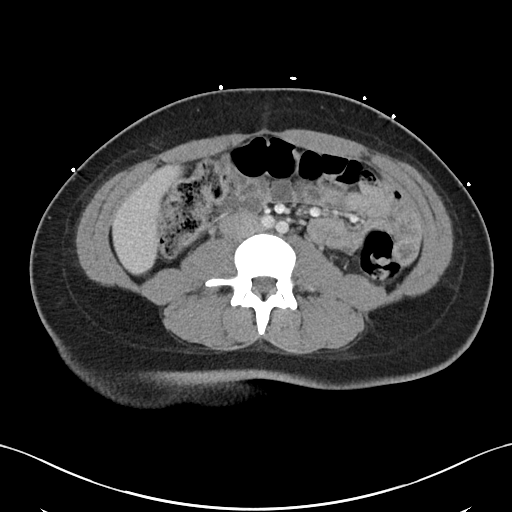
[im 57/94  soft-tissue]
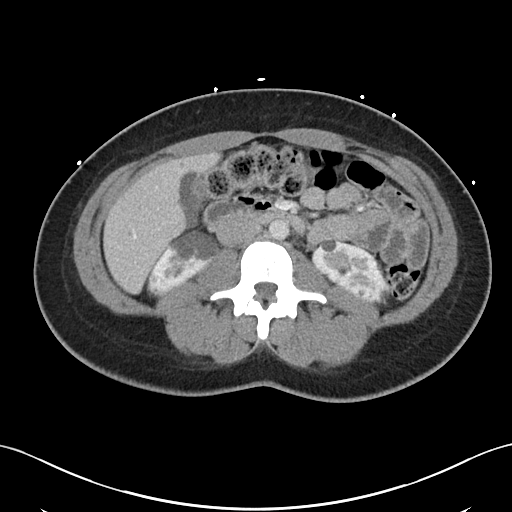
[im 57/94  bone]
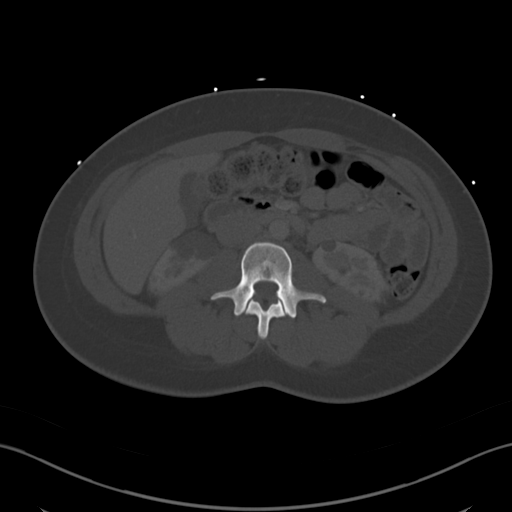
[im 63/94  soft-tissue]
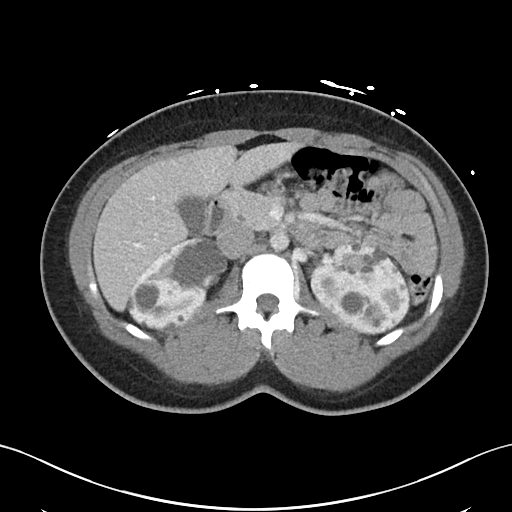
[im 68/94  soft-tissue]
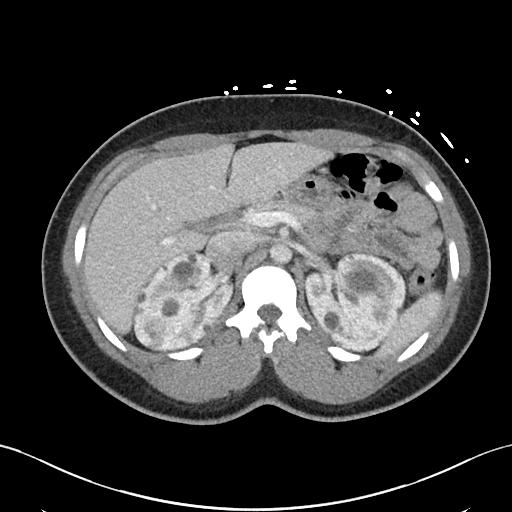
[im 73/94  soft-tissue]
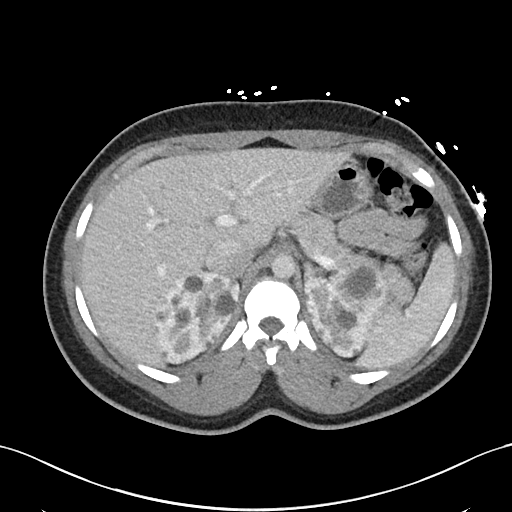
[im 83/94  soft-tissue]
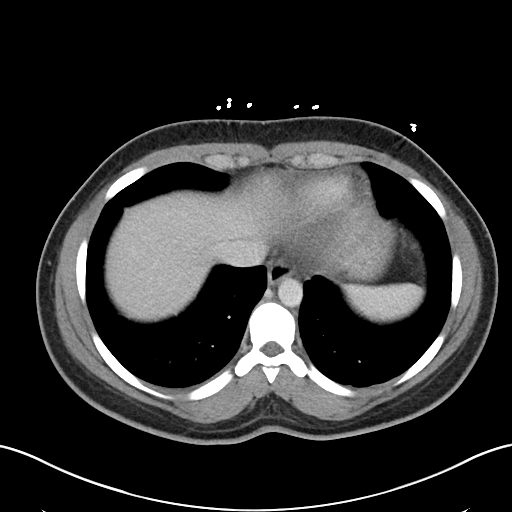
[im 88/94  soft-tissue]
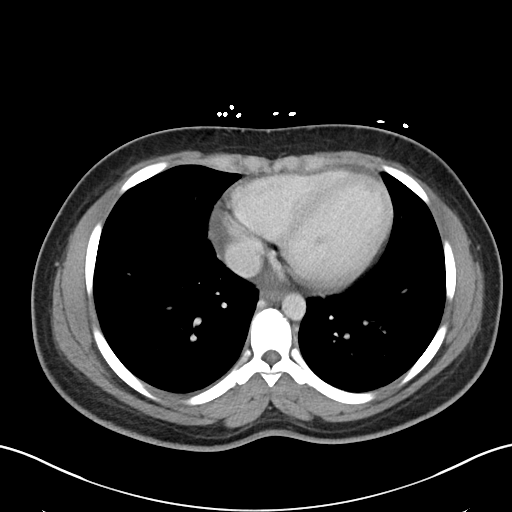

[Series 6: abdomen 3.0 mpr cor · coronal · 0.87mm/px · 3 of 98 slices shown]
[im 33/98  soft-tissue]
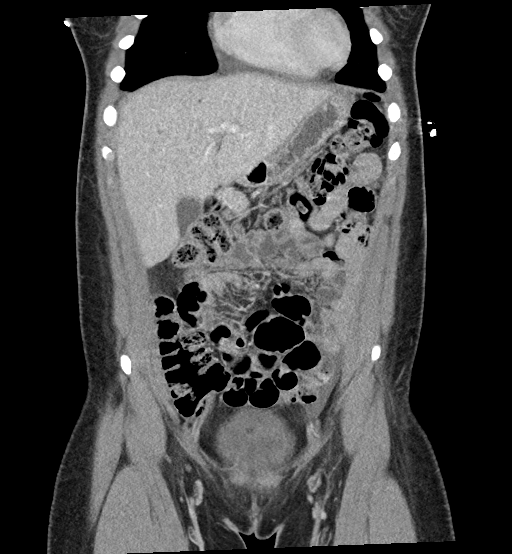
[im 44/98  soft-tissue]
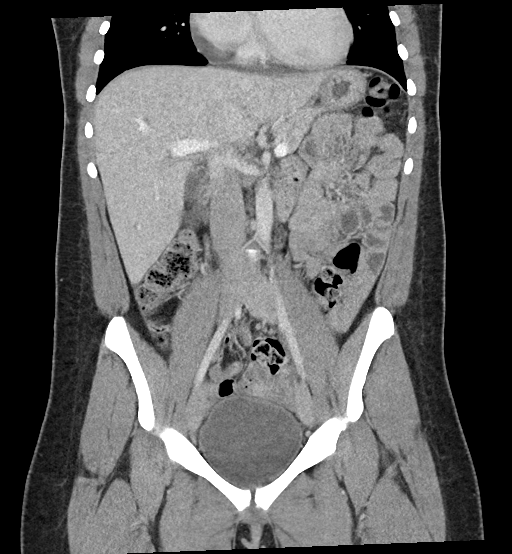
[im 54/98  soft-tissue]
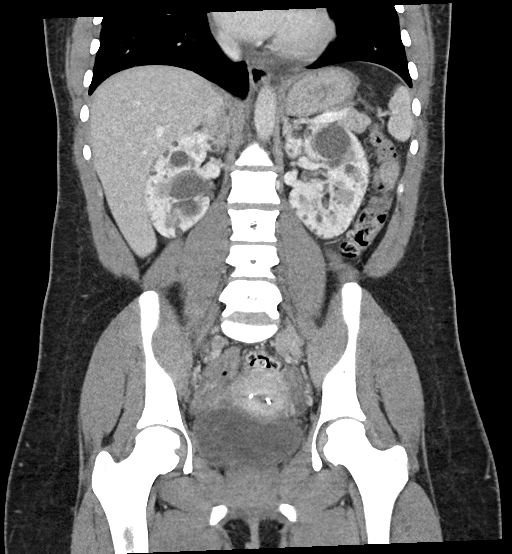

[17 of 46 positions shown; findings below may reference images not displayed]

FINDINGS: Lower chest:  Mild atelectasis at the left lung base.

Hepatobiliary: Few small scattered cysts with simple appearance in
the liver. No evidence of biliary obstruction or stone.

Pancreas: Unremarkable.

Spleen: Unremarkable.

Adrenals/Urinary Tract: Negative adrenals. Numerous bilateral renal
cysts with simple appearance. A lobulated cyst or thinly septated
cyst in the right kidney measures up to 3.7 cm on axial slices. No
hydronephrosis or ureteral calculus.

Stomach/Bowel:  No obstruction. No appendicitis.

Vascular/Lymphatic: No acute vascular abnormality. No mass or
adenopathy.

Reproductive:IUD in place.

Other: Trace pelvic fluid which could be physiologic.

Musculoskeletal: No acute abnormalities.
IMPRESSION: 1. Small volume pelvic fluid which could be physiologic.
2. Polycystic kidneys.

## 2022-06-24 IMAGING — US US PELVIS COMPLETE TRANSABD/TRANSVAG W DUPLEX
1 series · 13 of 25 positions shown · non-contrast
Comparison: Abdominal CT from earlier today

CLINICAL DATA: Pelvic pain in a female

EXAM:
TRANSABDOMINAL AND TRANSVAGINAL ULTRASOUND OF PELVIS
DOPPLER ULTRASOUND OF OVARIES
TECHNIQUE: Both transabdominal and transvaginal ultrasound examinations of the
pelvis were performed. Transabdominal technique was performed for
global imaging of the pelvis including uterus, ovaries, adnexal
regions, and pelvic cul-de-sac.
It was necessary to proceed with endovaginal exam following the
transabdominal exam to visualize the endometrium. Color and duplex
Doppler ultrasound was utilized to evaluate blood flow to the
ovaries.

[Series 1: us pelvic complete w transvaginal and torsion righ · 13 of 123 slices shown]
[im 1/123]
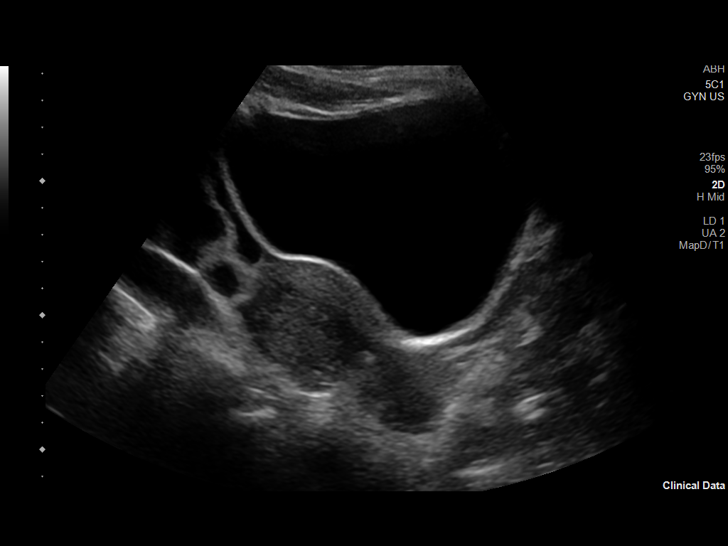
[im 11/123]
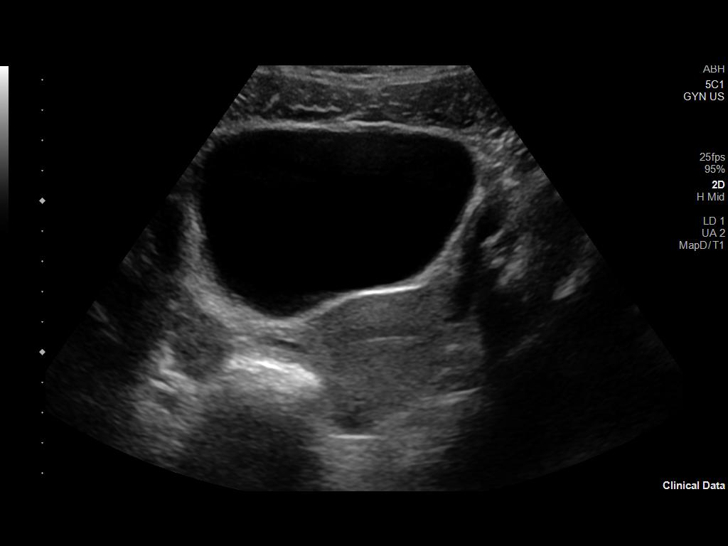
[im 21/123]
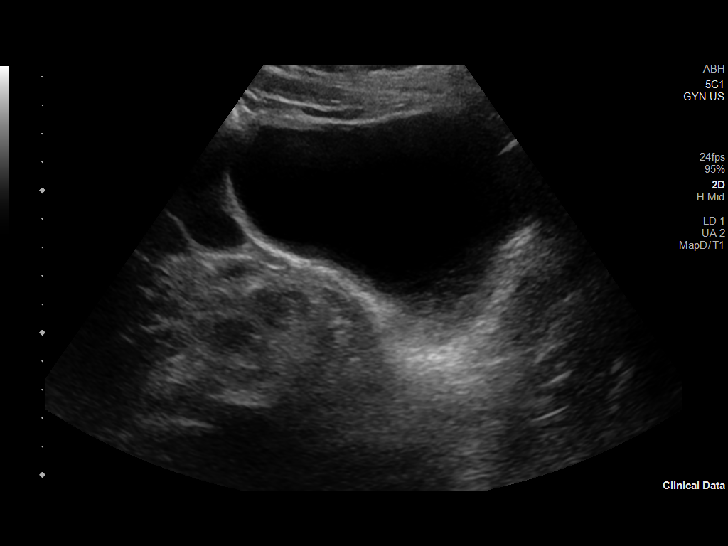
[im 31/123]
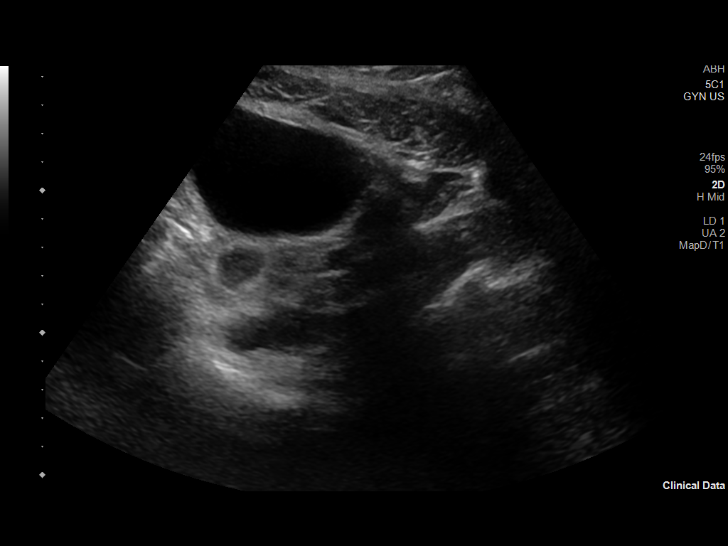
[im 41/123]
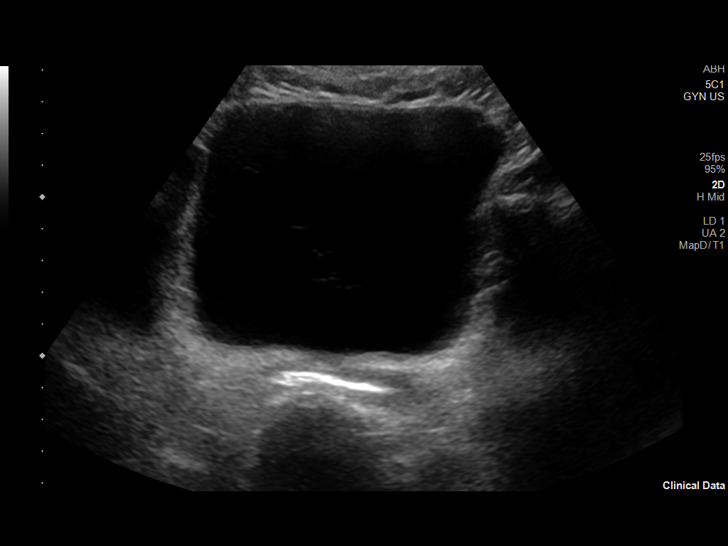
[im 51/123]
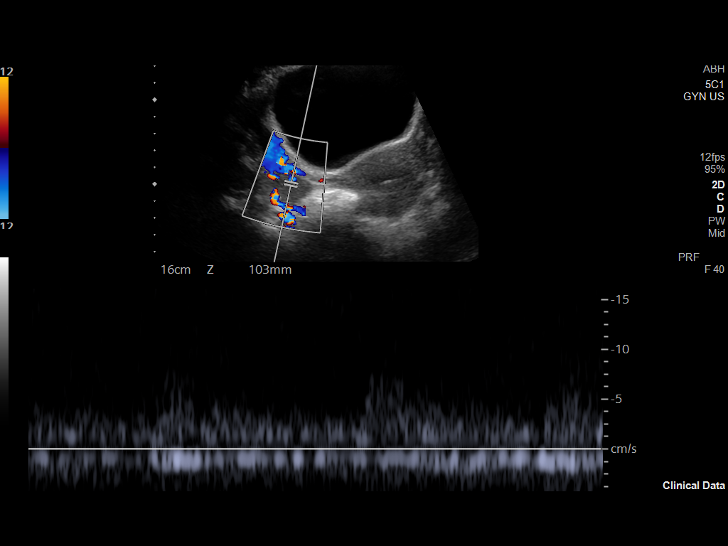
[im 62/123]
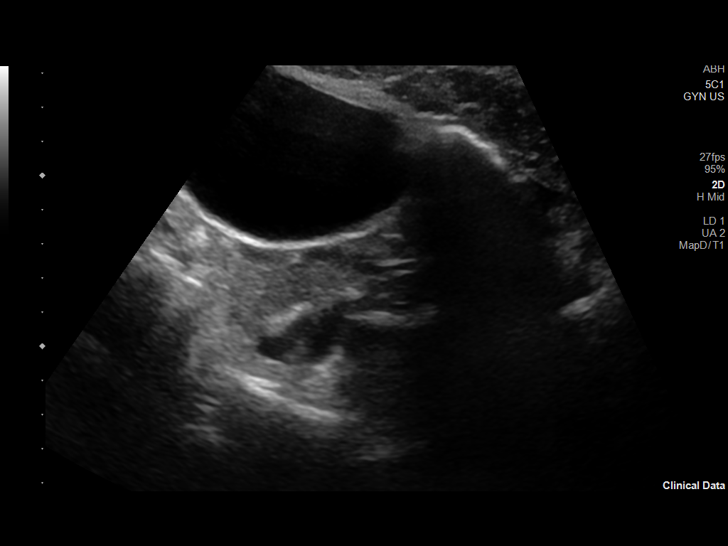
[im 72/123]
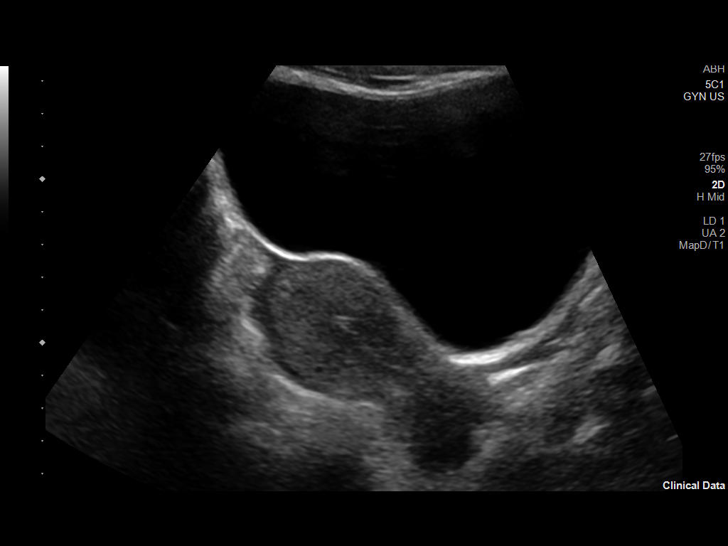
[im 82/123]
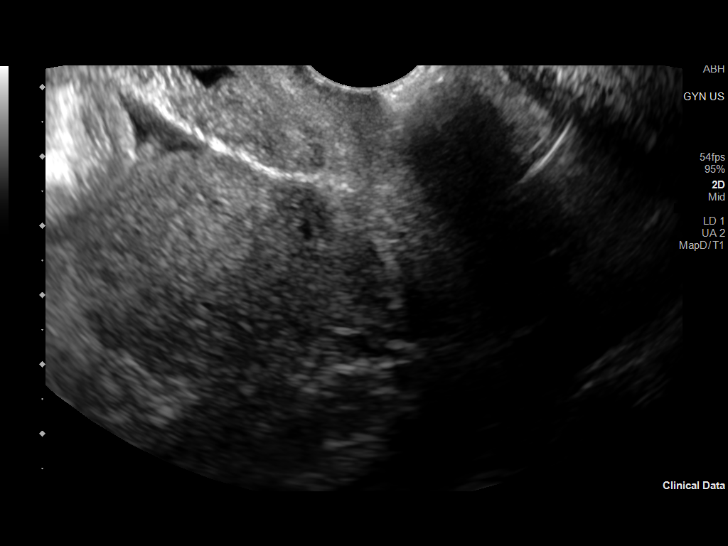
[im 92/123]
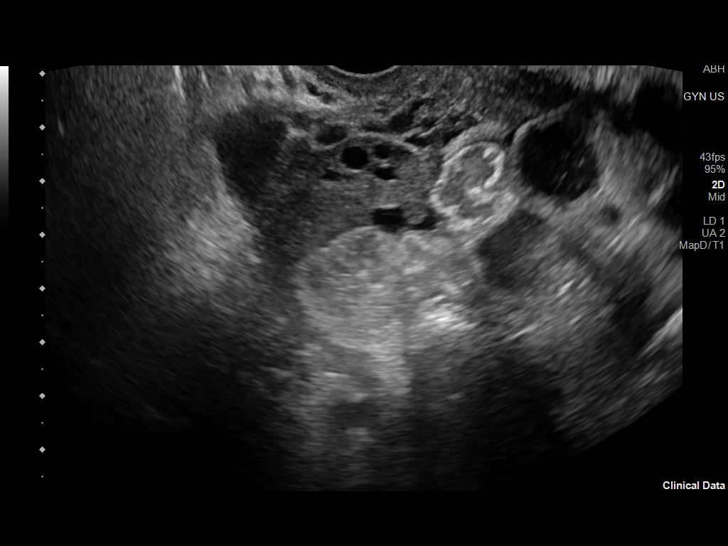
[im 102/123]
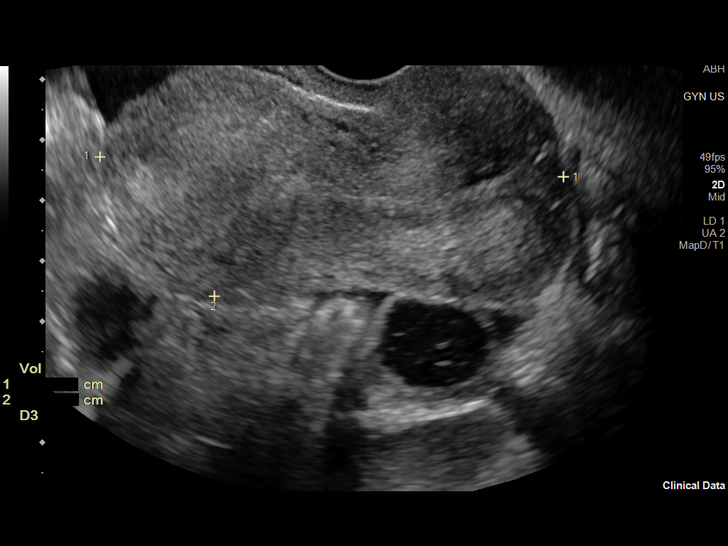
[im 112/123]
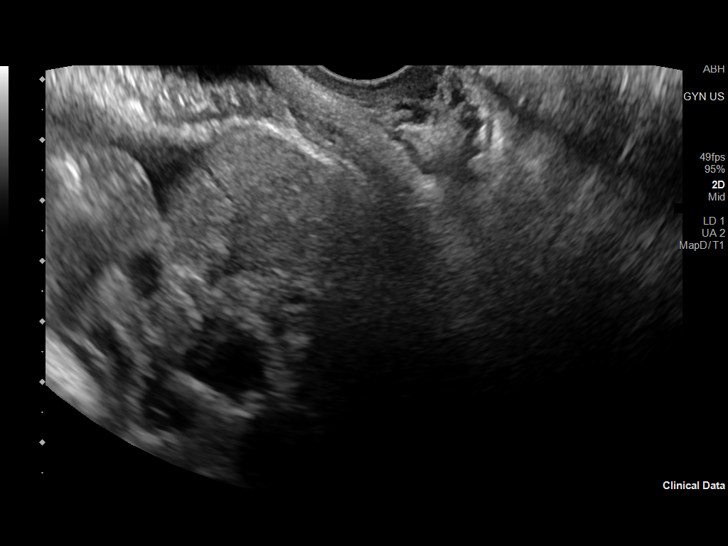
[im 123/123]
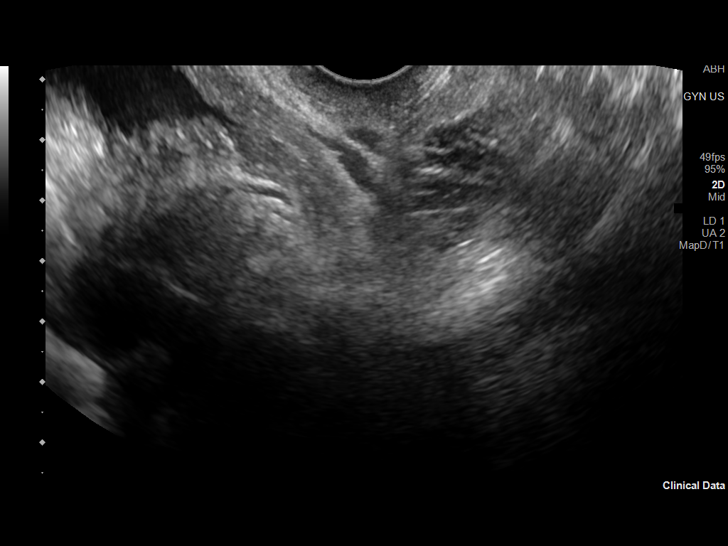

[13 of 25 positions shown; findings below may reference images not displayed]

FINDINGS: Uterus

Measurements: 8 x 4 x 5 cm = volume: 75 mL. No fibroids or other
mass visualized.

Endometrium

Thickness: IUD in place

Right ovary

Measurements: 34 x 17 x 29 mm = volume: 9 mL. Normal appearance/no
adnexal mass.

Left ovary

Measurements: 29 x 15 x 26 mm = volume: 6 mL. Normal appearance/no
adnexal mass.

Pulsed Doppler evaluation of the right ovary demonstrates normal
low-resistance arterial and venous waveforms. The left ovary has a
somewhat high resistance waveform, although diastolic flow is seen
throughout the cardiac cycle and it is reassuring that venous flow
was documented. There are certainly no morphologic changes of recent
or partial torsion.

Other findings

Small volume pelvic fluid.
IMPRESSION: 1. No clear cause for symptoms.
2. Normally located IUD.
3. Small volume pelvic fluid, simple and usually physiologic.
4. Present bilateral ovarian arterial and venous blood flow. Left
adnexal arterial waveform is higher in resistance but there are no
morphologic changes of torsion.

## 2022-08-18 ENCOUNTER — Emergency Department (HOSPITAL_BASED_OUTPATIENT_CLINIC_OR_DEPARTMENT_OTHER): Payer: BC Managed Care – PPO

## 2022-08-18 ENCOUNTER — Emergency Department (HOSPITAL_BASED_OUTPATIENT_CLINIC_OR_DEPARTMENT_OTHER)
Admission: EM | Admit: 2022-08-18 | Discharge: 2022-08-18 | Disposition: A | Discharge status: Home / Self Care | Payer: BC Managed Care – PPO | Attending: Emergency Medicine | Admitting: Emergency Medicine

## 2022-08-18 ENCOUNTER — Other Ambulatory Visit: Payer: Self-pay

## 2022-08-18 ENCOUNTER — Encounter (HOSPITAL_BASED_OUTPATIENT_CLINIC_OR_DEPARTMENT_OTHER): Payer: Self-pay | Admitting: Emergency Medicine

## 2022-08-18 DIAGNOSIS — D649 Anemia, unspecified: Secondary | ICD-10-CM | POA: Insufficient documentation

## 2022-08-18 DIAGNOSIS — R1084 Generalized abdominal pain: Secondary | ICD-10-CM | POA: Diagnosis not present

## 2022-08-18 DIAGNOSIS — K625 Hemorrhage of anus and rectum: Secondary | ICD-10-CM | POA: Diagnosis present

## 2022-08-18 DIAGNOSIS — Z91040 Latex allergy status: Secondary | ICD-10-CM | POA: Insufficient documentation

## 2022-08-18 DIAGNOSIS — D509 Iron deficiency anemia, unspecified: Secondary | ICD-10-CM

## 2022-08-18 DIAGNOSIS — K602 Anal fissure, unspecified: Secondary | ICD-10-CM | POA: Diagnosis not present

## 2022-08-18 LAB — URINALYSIS, ROUTINE W REFLEX MICROSCOPIC
Bilirubin Urine: NEGATIVE
Glucose, UA: NEGATIVE mg/dL
Ketones, ur: NEGATIVE mg/dL
Nitrite: NEGATIVE
Protein, ur: NEGATIVE mg/dL
Specific Gravity, Urine: 1.02 (ref 1.005–1.030)
pH: 6.5 (ref 5.0–8.0)

## 2022-08-18 LAB — CBC WITH DIFFERENTIAL/PLATELET
Abs Immature Granulocytes: 0.02 10*3/uL (ref 0.00–0.07)
Basophils Absolute: 0.1 10*3/uL (ref 0.0–0.1)
Basophils Relative: 1 %
Eosinophils Absolute: 0.1 10*3/uL (ref 0.0–0.5)
Eosinophils Relative: 1 %
HCT: 31 % — ABNORMAL LOW (ref 36.0–46.0)
Hemoglobin: 9.3 g/dL — ABNORMAL LOW (ref 12.0–15.0)
Immature Granulocytes: 0 %
Lymphocytes Relative: 35 %
Lymphs Abs: 2.5 10*3/uL (ref 0.7–4.0)
MCH: 21.4 pg — ABNORMAL LOW (ref 26.0–34.0)
MCHC: 30 g/dL (ref 30.0–36.0)
MCV: 71.4 fL — ABNORMAL LOW (ref 80.0–100.0)
Monocytes Absolute: 0.4 10*3/uL (ref 0.1–1.0)
Monocytes Relative: 5 %
Neutro Abs: 4.1 10*3/uL (ref 1.7–7.7)
Neutrophils Relative %: 58 %
Platelets: 379 10*3/uL (ref 150–400)
RBC: 4.34 MIL/uL (ref 3.87–5.11)
RDW: 16.7 % — ABNORMAL HIGH (ref 11.5–15.5)
WBC: 7.1 10*3/uL (ref 4.0–10.5)
nRBC: 0 % (ref 0.0–0.2)

## 2022-08-18 LAB — COMPREHENSIVE METABOLIC PANEL
ALT: 11 U/L (ref 0–44)
AST: 16 U/L (ref 15–41)
Albumin: 4 g/dL (ref 3.5–5.0)
Alkaline Phosphatase: 47 U/L (ref 38–126)
Anion gap: 5 (ref 5–15)
BUN: 9 mg/dL (ref 6–20)
CO2: 25 mmol/L (ref 22–32)
Calcium: 8.4 mg/dL — ABNORMAL LOW (ref 8.9–10.3)
Chloride: 109 mmol/L (ref 98–111)
Creatinine, Ser: 0.77 mg/dL (ref 0.44–1.00)
GFR, Estimated: >60 mL/min (ref >60)
Glucose, Bld: 97 mg/dL (ref 70–99)
Potassium: 3.5 mmol/L (ref 3.5–5.1)
Sodium: 139 mmol/L (ref 135–145)
Total Bilirubin: 0.5 mg/dL (ref 0.3–1.2)
Total Protein: 8.1 g/dL (ref 6.5–8.1)

## 2022-08-18 LAB — PREGNANCY, URINE: Preg Test, Ur: NEGATIVE

## 2022-08-18 LAB — LIPASE, BLOOD: Lipase: 26 U/L (ref 11–51)

## 2022-08-18 LAB — URINALYSIS, MICROSCOPIC (REFLEX)

## 2022-08-18 LAB — OCCULT BLOOD X 1 CARD TO LAB, STOOL: Fecal Occult Bld: NEGATIVE

## 2022-08-18 MED ORDER — ONDANSETRON HCL 4 MG PO TABS: 4.0000 mg | ORAL_TABLET | Freq: Three times a day (TID) | ORAL | 0 refills | Status: AC | PRN

## 2022-08-18 MED ORDER — SODIUM CHLORIDE 0.9 % IV BOLUS
1000.0000 mL | Freq: Once | INTRAVENOUS | Status: AC
  Administered 2022-08-18: 1000 mL via INTRAVENOUS

## 2022-08-18 MED ORDER — ALUM & MAG HYDROXIDE-SIMETH 200-200-20 MG/5ML PO SUSP
30.0000 mL | Freq: Once | ORAL | Status: AC
  Administered 2022-08-18: 30 mL via ORAL
  Filled 2022-08-18: qty 30

## 2022-08-18 MED ORDER — MORPHINE SULFATE (PF) 4 MG/ML IV SOLN
4.0000 mg | Freq: Once | INTRAVENOUS | Status: AC
  Administered 2022-08-18: 4 mg via INTRAVENOUS
  Filled 2022-08-18: qty 1

## 2022-08-18 MED ORDER — ONDANSETRON HCL 4 MG/2ML IJ SOLN
4.0000 mg | Freq: Once | INTRAMUSCULAR | Status: AC
  Administered 2022-08-18: 4 mg via INTRAVENOUS
  Filled 2022-08-18: qty 2

## 2022-08-18 MED ORDER — IOHEXOL 300 MG/ML  SOLN
100.0000 mL | Freq: Once | INTRAMUSCULAR | Status: AC | PRN
  Administered 2022-08-18: 100 mL via INTRAVENOUS

## 2022-08-18 MED ORDER — DICYCLOMINE HCL 20 MG PO TABS: 20.0000 mg | ORAL_TABLET | Freq: Two times a day (BID) | ORAL | 0 refills | Status: AC

## 2022-08-18 MED ORDER — NIFEDIPINE 0.3 % OINTMENT: 1.0000 | TOPICAL_OINTMENT | Freq: Four times a day (QID) | CUTANEOUS | 0 refills | Status: AC

## 2022-08-18 MED ORDER — FERROUS SULFATE 325 (65 FE) MG PO TABS: 325.0000 mg | ORAL_TABLET | Freq: Every day | ORAL | 0 refills | Status: DC

## 2022-08-18 NOTE — ED Provider Notes (Signed)
[facility] EMERGENCY DEPARTMENT Provider Note   CSN: [id number] Arrival date & time: 08/18/22  1743     History  Chief Complaint  Patient presents with   Abdominal Pain   Rectal Bleeding    Heather Goodman is a 21 y.o. female.  Pt is a 21 yo female with a pmhx significant for polycystic kidney disease and ovarian cysts.  Pt said she's been having a lot of lower abd pain and bloody stool for the past 10 days.  Pt did see her pcp on 11/8.  She was given a rx for hemorrhoids, but sx are worsening. Pcp did refer to GI, but she has not seen them yet.       Home Medications Prior to Admission medications   Medication Sig Start Date End Date Taking? Authorizing Provider  dicyclomine (BENTYL) 20 MG tablet Take 1 tablet (20 mg total) by mouth 2 (two) times daily. 08/18/22  Yes Isla Pence, MD  nifedipine 0.3 % ointment Place 1 Application rectally 4 (four) times daily. 08/18/22  Yes Isla Pence, MD  ondansetron (ZOFRAN) 4 MG tablet Take 1 tablet (4 mg total) by mouth every 8 (eight) hours as needed for nausea or vomiting. 08/18/22  Yes Isla Pence, MD  acetaminophen (TYLENOL) 325 MG tablet Take 650 mg by mouth every 6 (six) hours as needed for moderate pain or headache. 01/05/15   [provider]  ferrous sulfate 325 (65 FE) MG tablet Take 1 tablet (325 mg total) by mouth daily. 08/18/22 09/17/22  Isla Pence, MD  levonorgestrel (MIRENA, 52 MG,) 20 MCG/24HR IUD 1 Intra Uterine Device by Intrauterine route. 05/09/19   [provider]  Multiple Vitamins-Minerals (ONE-A-DAY WOMENS) tablet Take 1 tablet by mouth daily.    [provider]  phenazopyridine (PYRIDIUM) 200 MG tablet Take 1 tablet (200 mg total) by mouth 3 (three) times daily as needed for pain. 01/21/21   Muthersbaugh, Jarrett Soho, PA-C  Vitamin D, Ergocalciferol, (DRISDOL) 1.25 MG (50000 UNIT) CAPS capsule Take 50,000 Units by mouth every Wednesday. 09/02/20   [provider]      Allergies    Aspirin, Bergera koenigii, and Latex    Review of Systems   Review of Systems  Gastrointestinal:  Positive for abdominal pain, blood in stool and rectal pain.  All other systems reviewed and are negative.   Physical Exam Updated Vital Signs BP (!) 139/102   Pulse (!) 55   Temp 98.5 F (36.9 C) (Oral)   Resp 17   Ht '5\' 8"'$  (1.727 m)   Wt 98 kg   LMP 06/06/2022 (Approximate)   SpO2 100%   BMI 32.84 kg/m  Physical Exam Vitals and nursing note reviewed. Exam conducted with a chaperone present.  Constitutional:      Appearance: She is well-developed. She is obese.  HENT:     Head: Normocephalic and atraumatic.     Mouth/Throat:     Mouth: Mucous membranes are moist.     Pharynx: Oropharynx is clear.  Eyes:     Extraocular Movements: Extraocular movements intact.     Pupils: Pupils are equal, round, and reactive to light.  Cardiovascular:     Rate and Rhythm: Normal rate and regular rhythm.     Heart sounds: Normal heart sounds.  Pulmonary:     Effort: Pulmonary effort is normal.     Breath sounds: Normal breath sounds.  Abdominal:     General: Abdomen is flat.     Palpations: Abdomen  is soft.     Tenderness: There is generalized abdominal tenderness.  Genitourinary:    Rectum: Tenderness and anal fissure present.  Skin:    Capillary Refill: Capillary refill takes less than 2 seconds.  Neurological:     General: No focal deficit present.     Mental Status: She is alert and oriented to person, place, and time.  Psychiatric:        Mood and Affect: Mood normal.        Behavior: Behavior normal.     ED Results / Procedures / Treatments   Labs (all labs ordered are listed, but only abnormal results are displayed) Labs Reviewed  COMPREHENSIVE METABOLIC PANEL - Abnormal; Notable for the following components:      Result Value   Calcium 8.4 (*)    All other components within normal limits  URINALYSIS, ROUTINE W REFLEX MICROSCOPIC  - Abnormal; Notable for the following components:   APPearance HAZY (*)    Hgb urine dipstick TRACE (*)    Leukocytes,Ua MODERATE (*)    All other components within normal limits  CBC WITH DIFFERENTIAL/PLATELET - Abnormal; Notable for the following components:   Hemoglobin 9.3 (*)    HCT 31.0 (*)    MCV 71.4 (*)    MCH 21.4 (*)    RDW 16.7 (*)    All other components within normal limits  URINALYSIS, MICROSCOPIC (REFLEX) - Abnormal; Notable for the following components:   Bacteria, UA RARE (*)    All other components within normal limits  LIPASE, BLOOD  PREGNANCY, URINE  OCCULT BLOOD X 1 CARD TO LAB, STOOL  VITAMIN B12  FOLATE  IRON AND TIBC  FERRITIN  RETICULOCYTES    EKG None  Radiology CT ABDOMEN PELVIS W CONTRAST  Result Date: 08/18/2022 CLINICAL DATA:  Nonlocalized abdomen pain EXAM: CT ABDOMEN AND PELVIS WITH CONTRAST TECHNIQUE: Multidetector CT imaging of the abdomen and pelvis was performed using the standard protocol following bolus administration of intravenous contrast. RADIATION DOSE REDUCTION: This exam was performed according to the departmental dose-optimization program which includes automated exposure control, adjustment of the mA and/or kV according to patient size and/or use of iterative reconstruction technique. CONTRAST:  163m OMNIPAQUE IOHEXOL 300 MG/ML  SOLN COMPARISON:  CT 01/21/2021 FINDINGS: Lower chest: Lung bases are clear. Small pericardial effusion at the cardiac base. Hepatobiliary: Hepatic cyst. No calcified stone but gallbladder is contracted. No biliary dilatation Pancreas: Unremarkable. No pancreatic ductal dilatation or surrounding inflammatory changes. Spleen: Normal in size without focal abnormality. Adrenals/Urinary Tract: Adrenal glands are normal. Polycystic kidneys. No hydronephrosis. Bladder unremarkable Stomach/Bowel: Stomach nonenlarged. No dilated small bowel. Possible thickening of left upper quadrant jejunal loops. Negative appendix.  Vascular/Lymphatic: No significant vascular findings are present. No enlarged abdominal or pelvic lymph nodes. Reproductive: Uterus and bilateral adnexa are unremarkable. Other: No abdominal wall hernia or abnormality. No abdominopelvic ascites. Musculoskeletal: No acute or significant osseous findings. IMPRESSION: 1. Possible thickening of left upper quadrant jejunal loops which may be secondary to enteritis of infectious or inflammatory etiology. 2. Polycystic kidneys. Small pericardial effusion. Electronically Signed   By: KDonavan FoilM.D.   On: 08/18/2022 22:05    Procedures Procedures    Medications Ordered in ED Medications  sodium chloride 0.9 % bolus 1,000 mL (0 mLs Intravenous Stopped 08/18/22 2243)  iohexol (OMNIPAQUE) 300 MG/ML solution 100 mL (100 mLs Intravenous Contrast Given 08/18/22 2122)  morphine (PF) 4 MG/ML injection 4 mg (4 mg Intravenous Given 08/18/22 2226)  ondansetron (ZOFRAN)  injection 4 mg (4 mg Intravenous Given 08/18/22 2226)  alum & mag hydroxide-simeth (MAALOX/MYLANTA) 200-200-20 MG/5ML suspension 30 mL (30 mLs Oral Given 08/18/22 2319)    ED Course/ Medical Decision Making/ A&P                           Medical Decision Making Amount and/or Complexity of Data Reviewed Labs: ordered. Radiology: ordered.  Risk OTC drugs. Prescription drug management.   This patient presents to the ED for concern of abd pain, this involves an extensive number of treatment options, and is a complaint that carries with it a high risk of complications and morbidity.  The differential diagnosis includes pregnancy, infection, gi bleed   Co morbidities that complicate the patient evaluation  polycystic kidney disease and ovarian cysts   Additional history obtained:  Additional history obtained from epic chart review   Lab Tests:  I Ordered, and personally interpreted labs.  The pertinent results include:  cbc with hgb down to 9.3 (hgb 12.1 in July of 2022); cmp nl;  preg neg   Imaging Studies ordered:  I ordered imaging studies including ct abd/pelvis  I independently visualized and interpreted imaging which showed MPRESSION: 1. Possible thickening of left upper quadrant jejunal loops which may be secondary to enteritis of infectious or inflammatory etiology. 2. Polycystic kidneys. Small pericardial effusion. I agree with the radiologist interpretation   Cardiac Monitoring:  The patient was maintained on a cardiac monitor.  I personally viewed and interpreted the cardiac monitored which showed an underlying rhythm of: nsr   Medicines ordered and prescription drug management:  I ordered medication including IVFs  for dehydration  Reevaluation of the patient after these medicines showed that the patient improved I have reviewed the patients home medicines and have made adjustments as needed   Test Considered:  ct   Critical Interventions:  Pain control   Problem List / ED Course:  Abd pain:  no etiology seen on CT.  Pt does have some anemia.  She is d/c with iron.  Pt does have mod LEs, but urine is contaminated with 6-10 squamous epis.  Pt does have a rectal fissure on exam + hemorrhoids.  She is d/c with nifedipine    Reevaluation:  After the interventions noted above, I reevaluated the patient and found that they have :improved   Social Determinants of Health:  Lives at home   Dispostion:  After consideration of the diagnostic results and the patients response to treatment, I feel that the patent would benefit from discharge with outpatient f/u.          Final Clinical Impression(s) / ED Diagnoses Final diagnoses:  Anal fissure  Generalized abdominal pain  Iron deficiency anemia, unspecified iron deficiency anemia type  Rectal bleeding    Rx / DC Orders ED Discharge Orders          Ordered    nifedipine 0.3 % ointment  4 times daily        08/18/22 2220    Ambulatory referral to Gastroenterology         08/18/22 2221    ferrous sulfate 325 (65 FE) MG tablet  Daily        08/18/22 2222    dicyclomine (BENTYL) 20 MG tablet  2 times daily        08/18/22 2247    ondansetron (ZOFRAN) 4 MG tablet  Every 8 hours PRN  08/18/22 2247              Isla Pence, MD 08/18/22 2333

## 2022-08-18 NOTE — ED Triage Notes (Signed)
Bloody stools and lower abdominal pain x 1.5 weeks. States possible fever. Also endorse headache. Reports hx of hemorrhoids. Has referral to GI, but unable to tolerate pain.

## 2022-08-18 NOTE — ED Notes (Signed)
Hemoccult card  at bedside

## 2022-08-18 NOTE — ED Notes (Signed)
Patient transported to CT 

## 2022-08-19 LAB — VITAMIN B12: Vitamin B-12: 109 pg/mL — ABNORMAL LOW (ref 180–914)

## 2022-08-19 LAB — IRON AND TIBC
Iron: 21 ug/dL — ABNORMAL LOW (ref 28–170)
Saturation Ratios: 5 % — ABNORMAL LOW (ref 10.4–31.8)
TIBC: 437 ug/dL (ref 250–450)
UIBC: 416 ug/dL

## 2022-08-19 LAB — RETICULOCYTES
Immature Retic Fract: 14.4 % (ref 2.3–15.9)
RBC.: 3.75 MIL/uL — ABNORMAL LOW (ref 3.87–5.11)
Retic Count, Absolute: 32.3 10*3/uL (ref 19.0–186.0)
Retic Ct Pct: 0.9 % (ref 0.4–3.1)

## 2022-08-19 LAB — FOLATE: Folate: 8 ng/mL (ref >5.9)

## 2022-08-19 LAB — FERRITIN: Ferritin: 3 ng/mL — ABNORMAL LOW (ref 11–307)

## 2022-08-21 ENCOUNTER — Encounter: Payer: Self-pay | Admitting: Gastroenterology

## 2022-09-04 ENCOUNTER — Encounter: Payer: Self-pay | Admitting: Gastroenterology

## 2022-09-04 ENCOUNTER — Ambulatory Visit (INDEPENDENT_AMBULATORY_CARE_PROVIDER_SITE_OTHER): Payer: BC Managed Care – PPO | Admitting: Gastroenterology

## 2022-09-04 ENCOUNTER — Other Ambulatory Visit (INDEPENDENT_AMBULATORY_CARE_PROVIDER_SITE_OTHER): Payer: BC Managed Care – PPO

## 2022-09-04 VITALS — BP 122/76 | HR 90 | Ht 68.0 in | Wt 215.0 lb

## 2022-09-04 DIAGNOSIS — E538 Deficiency of other specified B group vitamins: Secondary | ICD-10-CM | POA: Diagnosis not present

## 2022-09-04 DIAGNOSIS — R109 Unspecified abdominal pain: Secondary | ICD-10-CM | POA: Diagnosis not present

## 2022-09-04 DIAGNOSIS — R110 Nausea: Secondary | ICD-10-CM

## 2022-09-04 DIAGNOSIS — K625 Hemorrhage of anus and rectum: Secondary | ICD-10-CM

## 2022-09-04 DIAGNOSIS — D509 Iron deficiency anemia, unspecified: Secondary | ICD-10-CM

## 2022-09-04 DIAGNOSIS — R9389 Abnormal findings on diagnostic imaging of other specified body structures: Secondary | ICD-10-CM

## 2022-09-04 LAB — C-REACTIVE PROTEIN: CRP: <1 mg/dL (ref 0.5–20.0)

## 2022-09-04 LAB — SEDIMENTATION RATE: Sed Rate: 36 mm/hr — ABNORMAL HIGH (ref 0–20)

## 2022-09-04 MED ORDER — CYANOCOBALAMIN 1000 MCG/ML IJ SOLN: INTRAMUSCULAR | 0 refills | Status: DC

## 2022-09-04 NOTE — Progress Notes (Addendum)
Chief Complaint: Abdo symptoms.  Referring Provider:  Buckley, [place] J, PA-C      ASSESSMENT AND PLAN;   #1. Abdo pain/rectal bleed with abn CT. R/O Crohn's disease  #3. Post prandial nausea with intermittent vomiting.  #2. IDA/B12 def. ?etiology. R/O IBD  Plan: -Check CRP, Sed rate -Stool studies for GI Pathogen (includes C. Diff) and Calprotectin -EGD/colon with miralax -B12 '1mg'$  IM every 2 weeks x 1 month, then Q month -Increase iron 65 mg p.o. BID -If still with problems, would consider VCE prior to push enteroscopy. -D/W mom over the FaceTime and BF in room.     HPI:    Heather Goodman is a 21 y.o. female  With history of polycystic kidney disease  C/O Lower Abdo crampy pain x several months, worse over last 1 month, somewhat better with BMs Alternating diarrhea and constipation.  Now better. Has been having some bright red blood per rectum since October 2023.  She does bring in pictures with blood outside the stool.  Also has been Dx with anal fissure in past. Hb 12 (01/2021) to 9.3 (08/18/2022) with iron/b12 def.  Has been having nausea with intermittent vomiting.  No heartburn.  No fever chills or night sweats.  She does feel fatigued.  Her Hb has dropped down from 12 to 9.3 as below.  She is not having any menstrual cycles for the last 3 to 4 months (on birth control pills).  She has previous history of anemia due to menorrhagia but none recently.  Has been taking iron supplements 1/day.  Has Fatigue  No sodas, chocolates, chewing gums, artificial sweeteners and candy. No NSAIDs  Also found to have B12 deficiency  Evaluated in ED underwent CT Abdo/pelvis on 08/18/2022 which showed possible thickening of the jejunal folds secondary to enteritis.  Rule out any inflammatory etiology.  Past work-up: CT AP with contrast 08/18/2022 1. Possible thickening of left upper quadrant jejunal loops which may be secondary to enteritis of infectious or  inflammatory etiology. 2. Polycystic kidneys. Small pericardial effusion   CT Abdo/pelvis with contrast 01/2021 1. Small volume pelvic fluid which could be physiologic. 2. Polycystic kidneys.     Latest Ref Rng & Units 08/18/2022    5:57 PM 01/21/2021    3:00 AM  CBC  WBC 4.0 - 10.5 K/uL 7.1  7.6   Hemoglobin 12.0 - 15.0 g/dL 9.3  12.1   Hematocrit 36.0 - 46.0 % 31.0  36.2   Platelets 150 - 400 K/uL 379  261    Iron/TIBC/Ferritin/ %Sat    Component Value Date/Time   IRON 21 (L) 08/18/2022 2236   TIBC 437 08/18/2022 2236   FERRITIN 3 (L) 08/18/2022 2236   IRONPCTSAT 5 (L) 08/18/2022 2236     Past Medical History:  Diagnosis Date   Ovarian cyst    Polycystic kidney disease, childhood type (CPKD)     History reviewed. No pertinent surgical history.  Family History  Problem Relation Age of Onset   Kidney disease Mother    Kidney disease Sister    Crohn's disease Paternal Aunt    Stomach cancer Neg Hx    Colon cancer Neg Hx    Esophageal cancer Neg Hx     Social History   Tobacco Use   Smoking status: Never   Smokeless tobacco: Never  Vaping Use   Vaping Use: Never used  Substance Use Topics   Alcohol use: Never   Drug use: Never    Current  Outpatient Medications  Medication Sig Dispense Refill   acetaminophen (TYLENOL) 325 MG tablet Take 650 mg by mouth every 6 (six) hours as needed for moderate pain or headache.     dicyclomine (BENTYL) 20 MG tablet Take 1 tablet (20 mg total) by mouth 2 (two) times daily. 20 tablet 0   ferrous sulfate 325 (65 FE) MG tablet Take 1 tablet (325 mg total) by mouth daily. 30 tablet 0   levonorgestrel (MIRENA, 52 MG,) 20 MCG/24HR IUD 1 Intra Uterine Device by Intrauterine route.     Multiple Vitamins-Minerals (ONE-A-DAY WOMENS) tablet Take 1 tablet by mouth daily.     nifedipine 0.3 % ointment Place 1 Application rectally 4 (four) times daily. 30 g 0   ondansetron (ZOFRAN) 4 MG tablet Take 1 tablet (4 mg total) by mouth every  8 (eight) hours as needed for nausea or vomiting. 20 tablet 0   phenazopyridine (PYRIDIUM) 200 MG tablet Take 1 tablet (200 mg total) by mouth 3 (three) times daily as needed for pain. 6 tablet 0   Vitamin D, Ergocalciferol, (DRISDOL) 1.25 MG (50000 UNIT) CAPS capsule Take 50,000 Units by mouth every Wednesday.     No current facility-administered medications for this visit.    Allergies  Allergen Reactions   Aspirin Other (See Comments)    Other reaction(s): Unknown   Bergera Koenigii Swelling   Latex Rash    Review of Systems:  Constitutional: Denies fever, chills, diaphoresis, appetite change and fatigue.  HEENT: Denies photophobia, eye pain, redness, hearing loss, ear pain, congestion, sore throat, rhinorrhea, sneezing, mouth sores, neck pain, neck stiffness and tinnitus.   Respiratory: Denies SOB, DOE, cough, chest tightness,  and wheezing.   Cardiovascular: Denies chest pain, palpitations and leg swelling.  Genitourinary: Denies dysuria, urgency, frequency, hematuria, flank pain and difficulty urinating.  Musculoskeletal: Denies myalgias, back pain, joint swelling, arthralgias and gait problem.  Skin: No rash.  Neurological: Denies dizziness, seizures, syncope, weakness, light-headedness, numbness and headaches.  Hematological: Denies adenopathy. Easy bruising, personal or family bleeding history  Psychiatric/Behavioral: No anxiety or depression     Physical Exam:    BP 122/76   Pulse 90   Ht '5\' 8"'$  (1.727 m)   Wt 215 lb (97.5 kg)   LMP 06/06/2022 (Approximate)   BMI 32.69 kg/m  Wt Readings from Last 3 Encounters:  09/04/22 215 lb (97.5 kg)  08/18/22 216 lb (98 kg)  01/21/21 242 lb 8.1 oz (110 kg) (>99 %, Z= 2.43)*   * Growth percentiles are based on CDC (Girls, 2-20 Years) data.   Constitutional:  Well-developed, in no acute distress. Psychiatric: Normal mood and affect. Behavior is normal. HEENT: Pupils normal.  Conjunctivae are normal. No scleral icterus.   Cardiovascular: Normal rate, regular rhythm. No edema Pulmonary/chest: Effort normal and breath sounds normal. No wheezing, rales or rhonchi. Abdominal: Soft, nondistended. Nontender. Bowel sounds active throughout. There are no masses palpable. No hepatomegaly. Rectal: Deferred.  To be done at the time of colonoscopy. Neurological: Alert and oriented to person place and time. Skin: Skin is warm and dry. No rashes noted.  Data Reviewed: I have personally reviewed following labs and imaging studies  CBC:    Latest Ref Rng & Units 08/18/2022    5:57 PM 01/21/2021    3:00 AM  CBC  WBC 4.0 - 10.5 K/uL 7.1  7.6   Hemoglobin 12.0 - 15.0 g/dL 9.3  12.1   Hematocrit 36.0 - 46.0 % 31.0  36.2   Platelets 150 -  400 K/uL 379  261     CMP:    Latest Ref Rng & Units 08/18/2022    5:57 PM 01/21/2021    3:00 AM  CMP  Glucose 70 - 99 mg/dL 97  90   BUN 6 - 20 mg/dL 9  8   Creatinine 0.44 - 1.00 mg/dL 0.77  0.82   Sodium 135 - 145 mmol/L 139  138   Potassium 3.5 - 5.1 mmol/L 3.5  3.6   Chloride 98 - 111 mmol/L 109  108   CO2 22 - 32 mmol/L 25  20   Calcium 8.9 - 10.3 mg/dL 8.4  8.8   Total Protein 6.5 - 8.1 g/dL 8.1  6.3   Total Bilirubin 0.3 - 1.2 mg/dL 0.5  0.5   Alkaline Phos 38 - 126 U/L 47  41   AST 15 - 41 U/L 16  15   ALT 0 - 44 U/L 11  11     GFR: Estimated Creatinine Clearance: 135.7 mL/min (by C-G formula based on SCr of 0.77 mg/dL). Liver Function Tests: No results for input(s): "AST", "ALT", "ALKPHOS", "BILITOT", "PROT", "ALBUMIN" in the last 168 hours. No results for input(s): "LIPASE", "AMYLASE" in the last 168 hours. No results for input(s): "AMMONIA" in the last 168 hours. Coagulation Profile: No results for input(s): "INR", "PROTIME" in the last 168 hours. HbA1C: No results for input(s): "HGBA1C" in the last 72 hours. Lipid Profile: No results for input(s): "CHOL", "HDL", "LDLCALC", "TRIG", "CHOLHDL", "LDLDIRECT" in the last 72 hours. Thyroid Function Tests: No  results for input(s): "TSH", "T4TOTAL", "FREET4", "T3FREE", "THYROIDAB" in the last 72 hours. Anemia Panel: No results for input(s): "VITAMINB12", "FOLATE", "FERRITIN", "TIBC", "IRON", "RETICCTPCT" in the last 72 hours.  No results found for this or any previous visit (from the past 240 hour(s)).    Radiology Studies: CT ABDOMEN PELVIS W CONTRAST  Result Date: 08/18/2022 CLINICAL DATA:  Nonlocalized abdomen pain EXAM: CT ABDOMEN AND PELVIS WITH CONTRAST TECHNIQUE: Multidetector CT imaging of the abdomen and pelvis was performed using the standard protocol following bolus administration of intravenous contrast. RADIATION DOSE REDUCTION: This exam was performed according to the departmental dose-optimization program which includes automated exposure control, adjustment of the mA and/or kV according to patient size and/or use of iterative reconstruction technique. CONTRAST:  164m OMNIPAQUE IOHEXOL 300 MG/ML  SOLN COMPARISON:  CT 01/21/2021 FINDINGS: Lower chest: Lung bases are clear. Small pericardial effusion at the cardiac base. Hepatobiliary: Hepatic cyst. No calcified stone but gallbladder is contracted. No biliary dilatation Pancreas: Unremarkable. No pancreatic ductal dilatation or surrounding inflammatory changes. Spleen: Normal in size without focal abnormality. Adrenals/Urinary Tract: Adrenal glands are normal. Polycystic kidneys. No hydronephrosis. Bladder unremarkable Stomach/Bowel: Stomach nonenlarged. No dilated small bowel. Possible thickening of left upper quadrant jejunal loops. Negative appendix. Vascular/Lymphatic: No significant vascular findings are present. No enlarged abdominal or pelvic lymph nodes. Reproductive: Uterus and bilateral adnexa are unremarkable. Other: No abdominal wall hernia or abnormality. No abdominopelvic ascites. Musculoskeletal: No acute or significant osseous findings. IMPRESSION: 1. Possible thickening of left upper quadrant jejunal loops which may be secondary  to enteritis of infectious or inflammatory etiology. 2. Polycystic kidneys. Small pericardial effusion. Electronically Signed   By: KDonavan FoilM.D.   On: 08/18/2022 22:05      RCarmell Austria MD 09/04/2022, 11:26 AM  Cc: Buckley, G[place]J, PA-C

## 2022-09-04 NOTE — Patient Instructions (Addendum)
_______________________________________________________  If you are age 21 or older, your body mass index should be between 23-30. Your Body mass index is 32.69 kg/m. If this is out of the aforementioned range listed, please consider follow up with your Primary Care Provider.  If you are age 42 or younger, your body mass index should be between 19-25. Your Body mass index is 32.69 kg/m. If this is out of the aformentioned range listed, please consider follow up with your Primary Care Provider.   ________________________________________________________  The [facility] providers would like to encourage you to use [facility] to communicate with providers for non-urgent requests or questions.  Due to long hold times on the telephone, sending your provider a message by [facility] may be a faster and more efficient way to get a response.  Please allow 48 business hours for a response.  Please remember that this is for non-urgent requests.  _______________________________________________________  Your provider has requested that you go to the basement level for lab work before leaving today. Press "B" on the elevator. The lab is located at the first door on the left as you exit the elevator.  Increase Iron to 2 times a day  We have sent the following medications to your pharmacy for you to pick up at your convenience: Vitamin b12 injections by muscle every 2 weeks for a month and then monthly Please call to schedule your first 2 appointments once you get the medications  You have been scheduled for an endoscopy and colonoscopy. Please follow the written instructions given to you at your visit today. Please pick up your prep supplies at the pharmacy within the next 1-3 days. If you use inhalers (even only as needed), please bring them with you on the day of your procedure.  Thank you,  Dr. Jackquline Denmark

## 2022-09-08 ENCOUNTER — Emergency Department (HOSPITAL_COMMUNITY)
Admission: EM | Admit: 2022-09-08 | Discharge: 2022-09-09 | Discharge status: Against Medical Advice | Payer: BC Managed Care – PPO | Attending: Physician Assistant | Admitting: Physician Assistant

## 2022-09-08 ENCOUNTER — Other Ambulatory Visit: Payer: Self-pay

## 2022-09-08 ENCOUNTER — Encounter (HOSPITAL_COMMUNITY): Payer: Self-pay

## 2022-09-08 DIAGNOSIS — Z5321 Procedure and treatment not carried out due to patient leaving prior to being seen by health care provider: Secondary | ICD-10-CM | POA: Insufficient documentation

## 2022-09-08 DIAGNOSIS — R1084 Generalized abdominal pain: Secondary | ICD-10-CM | POA: Insufficient documentation

## 2022-09-08 LAB — CBC WITH DIFFERENTIAL/PLATELET
Abs Immature Granulocytes: 0.01 10*3/uL (ref 0.00–0.07)
Basophils Absolute: 0 10*3/uL (ref 0.0–0.1)
Basophils Relative: 1 %
Eosinophils Absolute: 0.1 10*3/uL (ref 0.0–0.5)
Eosinophils Relative: 2 %
HCT: 32.1 % — ABNORMAL LOW (ref 36.0–46.0)
Hemoglobin: 9.6 g/dL — ABNORMAL LOW (ref 12.0–15.0)
Immature Granulocytes: 0 %
Lymphocytes Relative: 34 %
Lymphs Abs: 2.3 10*3/uL (ref 0.7–4.0)
MCH: 21.7 pg — ABNORMAL LOW (ref 26.0–34.0)
MCHC: 29.9 g/dL — ABNORMAL LOW (ref 30.0–36.0)
MCV: 72.5 fL — ABNORMAL LOW (ref 80.0–100.0)
Monocytes Absolute: 0.7 10*3/uL (ref 0.1–1.0)
Monocytes Relative: 10 %
Neutro Abs: 3.7 10*3/uL (ref 1.7–7.7)
Neutrophils Relative %: 53 %
Platelets: 352 10*3/uL (ref 150–400)
RBC: 4.43 MIL/uL (ref 3.87–5.11)
RDW: 18.4 % — ABNORMAL HIGH (ref 11.5–15.5)
WBC: 6.8 10*3/uL (ref 4.0–10.5)
nRBC: 0 % (ref 0.0–0.2)

## 2022-09-08 LAB — COMPREHENSIVE METABOLIC PANEL
ALT: 11 U/L (ref 0–44)
AST: 16 U/L (ref 15–41)
Albumin: 3.6 g/dL (ref 3.5–5.0)
Alkaline Phosphatase: 41 U/L (ref 38–126)
Anion gap: 7 (ref 5–15)
BUN: 8 mg/dL (ref 6–20)
CO2: 21 mmol/L — ABNORMAL LOW (ref 22–32)
Calcium: 8.4 mg/dL — ABNORMAL LOW (ref 8.9–10.3)
Chloride: 110 mmol/L (ref 98–111)
Creatinine, Ser: 0.79 mg/dL (ref 0.44–1.00)
GFR, Estimated: >60 mL/min (ref >60)
Glucose, Bld: 105 mg/dL — ABNORMAL HIGH (ref 70–99)
Potassium: 3.7 mmol/L (ref 3.5–5.1)
Sodium: 138 mmol/L (ref 135–145)
Total Bilirubin: 0.3 mg/dL (ref 0.3–1.2)
Total Protein: 7 g/dL (ref 6.5–8.1)

## 2022-09-08 LAB — LIPASE, BLOOD: Lipase: 29 U/L (ref 11–51)

## 2022-09-08 NOTE — ED Triage Notes (Addendum)
Has been having abdominal pain all day but worsened to a 10/10 today an hour prior to arrival.   Nausea but denies vomiting. Scheduled on 10/21/22 for colonoscopy to r/o Chrons.   22g lac  170mg fentanyl IVP  '4mg'$  zofran IVP

## 2022-09-08 NOTE — ED Provider Triage Note (Signed)
Emergency Medicine Provider Triage Evaluation Note  Heather Goodman , a 21 y.o. female  was evaluated in triage.  Pt complains of abd pain, bloody stool. Dark and BRBPR. Working with GI to r/o crohn's. Abd pain diffuse in nature. No back pain. No UTI sx  Review of Systems  Positive: Abd pain, bloody stool Negative:   Physical Exam  LMP 06/06/2022 (Approximate)  Gen:   Awake, no distress   Resp:  Normal effort  MSK:   Moves extremities without difficulty  ABD:  Diffusely tender throughout Other:    Medical Decision Making  Medically screening exam initiated at 10:44 PM.  Appropriate orders placed.  Heather Goodman was informed that the remainder of the evaluation will be completed by another provider, this initial triage assessment does not replace that evaluation, and the importance of remaining in the ED until their evaluation is complete.  Abd pain   Lurine Imel A, PA-C 09/08/22 2252

## 2022-09-08 NOTE — ED Notes (Signed)
Hcg/ Labs for ct

## 2022-09-09 ENCOUNTER — Emergency Department (HOSPITAL_COMMUNITY): Payer: BC Managed Care – PPO

## 2022-09-09 ENCOUNTER — Emergency Department (HOSPITAL_COMMUNITY)
Admission: EM | Admit: 2022-09-09 | Discharge: 2022-09-09 | Discharge status: Against Medical Advice | Payer: BC Managed Care – PPO | Attending: Emergency Medicine | Admitting: Emergency Medicine

## 2022-09-09 ENCOUNTER — Other Ambulatory Visit: Payer: Self-pay

## 2022-09-09 ENCOUNTER — Other Ambulatory Visit: Payer: BC Managed Care – PPO

## 2022-09-09 ENCOUNTER — Telehealth: Payer: Self-pay | Admitting: Gastroenterology

## 2022-09-09 DIAGNOSIS — R109 Unspecified abdominal pain: Secondary | ICD-10-CM

## 2022-09-09 DIAGNOSIS — R9389 Abnormal findings on diagnostic imaging of other specified body structures: Secondary | ICD-10-CM

## 2022-09-09 DIAGNOSIS — K625 Hemorrhage of anus and rectum: Secondary | ICD-10-CM

## 2022-09-09 DIAGNOSIS — R110 Nausea: Secondary | ICD-10-CM | POA: Insufficient documentation

## 2022-09-09 DIAGNOSIS — D509 Iron deficiency anemia, unspecified: Secondary | ICD-10-CM

## 2022-09-09 DIAGNOSIS — E538 Deficiency of other specified B group vitamins: Secondary | ICD-10-CM

## 2022-09-09 DIAGNOSIS — Z5321 Procedure and treatment not carried out due to patient leaving prior to being seen by health care provider: Secondary | ICD-10-CM | POA: Insufficient documentation

## 2022-09-09 DIAGNOSIS — R1032 Left lower quadrant pain: Secondary | ICD-10-CM | POA: Diagnosis present

## 2022-09-09 LAB — I-STAT BETA HCG BLOOD, ED (MC, WL, AP ONLY): I-stat hCG, quantitative: <5 m[IU]/mL (ref <5)

## 2022-09-09 MED ORDER — IOHEXOL 350 MG/ML SOLN
75.0000 mL | Freq: Once | INTRAVENOUS | Status: AC | PRN
  Administered 2022-09-09: 75 mL via INTRAVENOUS

## 2022-09-09 NOTE — Telephone Encounter (Signed)
Patient's mother is calling again stating she received a call but not sure who it was from. Please advise

## 2022-09-09 NOTE — Telephone Encounter (Signed)
Patient mother calling says phone was disconnected during conversation.

## 2022-09-09 NOTE — ED Triage Notes (Signed)
Pt reports LLQ abd pain that started today but has progressively become worse tonight around 7pm associated with nausea. She reports she had a colonoscopy scheduled for 1/16 to rule out Crohn's disease.   She states she left [facility] ED tonight AMA.

## 2022-09-09 NOTE — ED Notes (Signed)
Request IV be removed to leave and go to another facility.

## 2022-09-09 NOTE — Telephone Encounter (Signed)
Inbound call from patient mother requesting a sooner procedure date for patient. There are none available with Dr. Lyndel Safe so she didn't know if another provider would be willing to do the procedure with a sooner date. States they have been to the ER on 3 separate occasions since her visit here and they offer no help. Patient mother is concerned her daughter can not wait until January. She is requesting a call back as soon as possible to advise.

## 2022-09-09 NOTE — Telephone Encounter (Signed)
Spoke to pt and pt mother stating that pt is requesting a sooner day if possible for the EGD/Colon: Pt was notified that Dr. Lyndel Safe does not have any sooner dates available: Pt is requesting something for the abdominal pain. Pt mother stated that they have been to the ER three times in the last month including a visit last night: Pt states that she has abdominal pain, cramping, bleeding in stool, fatigue, nausea, abdominal burning. Pt stated that she submitted stool samples to lab today: Please advise:

## 2022-09-09 NOTE — ED Notes (Signed)
Moved pt OTF.

## 2022-09-09 NOTE — ED Notes (Signed)
Pt stated that she was leaving due to the long wait time and she would follow up with her PCP. This tech removed the IV from her forearm.

## 2022-09-09 NOTE — ED Provider Triage Note (Signed)
Emergency Medicine Provider Triage Evaluation Note  Heather Goodman , a 21 y.o. female  was evaluated in triage.  Pt complains of abdominal pain.  States he is currently being worked-up for crohn's by GI.  She states pain worsening today, called the office and they told her to come in.  She went to [facility] initially and had labs done.  CT ordered but left prior to this being completed due to wait time.  Review of Systems  Positive: Abdominal pain Negative: fever  Physical Exam  BP 113/75 (BP Location: Right Arm)   Pulse 75   Temp 98.8 F (37.1 C) (Oral)   Resp (!) 22   Ht '5\' 8"'$  (1.727 m)   Wt 97.5 kg   LMP 06/06/2022 (Approximate)   SpO2 100%   BMI 32.69 kg/m   Gen:   Awake, no distress   Resp:  Normal effort  MSK:   Moves extremities without difficulty  Other:    Medical Decision Making  Medically screening exam initiated at 12:44 AM.  Appropriate orders placed.  [name] was informed that the remainder of the evaluation will be completed by another provider, this initial triage assessment does not replace that evaluation, and the importance of remaining in the ED until their evaluation is complete.  Abdominal pain.  Labs done around 11pm with normal chemistry and WBC count.  Will check hcg, UA.  Advised she will need to wait to have CT done.   Larene Pickett, PA-C 09/09/22 [phone]

## 2022-09-10 NOTE — Telephone Encounter (Signed)
Pt placed on list

## 2022-09-10 NOTE — Telephone Encounter (Signed)
I have checked with other providers as well. No one has sooner appointments.  Remo Lipps, Please put her on the list Any cancellation in our schedule, please work her in [place]

## 2022-09-11 NOTE — Telephone Encounter (Signed)
Patient called stating that she is still having a lot of pain and is requesting a call back to discuss pain management. Please advise.

## 2022-09-11 NOTE — Telephone Encounter (Signed)
Went over instructions and patient is aware and able to see on mychart. Message sent to insurance coordinators to [place] as well. Referral placed as well

## 2022-09-11 NOTE — Addendum Note (Signed)
Addended by: Curlene Labrum E on: 09/11/2022 05:20 PM   Modules accepted: Orders

## 2022-09-11 NOTE — Telephone Encounter (Signed)
LVM. Sent straight to voicemail

## 2022-09-12 NOTE — Telephone Encounter (Signed)
Advised pt yesterday no pain meds will be given. Do tylenol and will do procedure monday

## 2022-09-13 ENCOUNTER — Telehealth: Payer: Self-pay | Admitting: Gastroenterology

## 2022-09-13 MED ORDER — POLYETHYLENE GLYCOL 3350 17 GM/SCOOP PO POWD: 1.0000 | ORAL | 0 refills | Status: DC

## 2022-09-13 NOTE — Telephone Encounter (Signed)
The patient called the on-call provider at 11 PM Friday evening.  The reason for the phone call was that the patient does not have her bowel prep that she needs for her colonoscopy which is scheduled for Monday.  She states that she has not been told to do a 2-day prep, and will not need to start her prep until Sunday evening. Given the late hour at which she called for a nonurgent question, I told her that I would call her back the next day and discuss her bowel prep further. Review to her chart today.  She is scheduled for a colonoscopy with Dr. Lyndel Safe Monday after seeing him in clinic November 30.  Notes indicate plans for a MiraLAX bowel prep. I called the patient this afternoon and she did not answer.  I left a voicemail stating that I would put in a prescription for MiraLAX, otherwise she can also get it at any over-the-counter drugstore.  She should have been given written instructions on how to do the bowel prep at the time of her clinic visit.

## 2022-09-14 LAB — GI PROFILE, STOOL, PCR

## 2022-09-14 LAB — CALPROTECTIN, FECAL: Calprotectin, Fecal: 44 ug/g (ref 0–120)

## 2022-09-14 NOTE — Telephone Encounter (Signed)
Addendum: I tried calling the patient twice today, once at 9 AM and again at 12:40 PM.  Both times went straight to voicemail.  I left a message again explaining that she could buy the MiraLAX over-the-counter and that I had also placed a prescription for the MiraLAX to be picked up at her preferred pharmacy.

## 2022-09-15 ENCOUNTER — Telehealth: Payer: Self-pay

## 2022-09-15 ENCOUNTER — Telehealth: Payer: Self-pay | Admitting: Gastroenterology

## 2022-09-15 ENCOUNTER — Telehealth: Payer: Self-pay | Admitting: Pharmacy Technician

## 2022-09-15 ENCOUNTER — Other Ambulatory Visit (HOSPITAL_COMMUNITY): Payer: Self-pay

## 2022-09-15 ENCOUNTER — Ambulatory Visit: Payer: BC Managed Care – PPO | Admitting: Gastroenterology

## 2022-09-15 NOTE — Telephone Encounter (Signed)
Received call from [facility] about urgent lab result:  C diff toxin A B Positive:  Please advise

## 2022-09-15 NOTE — Telephone Encounter (Signed)
Spoke with pharmacist at [phone] said it was for a PA and it has been worked out

## 2022-09-15 NOTE — Progress Notes (Signed)
PT was canceled today unfortunately due to testing positive for C Diff. PT and mother were informed and Dr. Lyndel Safe will follow up with the patient.

## 2022-09-15 NOTE — Telephone Encounter (Signed)
Patient Advocate Encounter  Prior Authorization for VANCOMYCIN '125MG'$  has been approved.    PA# [id number] Effective dates: 12.11.23 through 3.10.24 Pharmacy has already processed.  Dreux Mcgroarty B. CPhT P: [phone] F: [phone]   Received notification from [place] that prior authorization for VANCOMYCIN '125MG'$  is required.   PA submitted on 12.11.23 Key [id number] Status is pending    Luciano Cutter, CPhT Patient Advocate Phone: [phone]

## 2022-09-15 NOTE — Telephone Encounter (Signed)
Inbound call from patient mother stating pharmacy needs more information from [place] regarding Vancomycin that was called in today. Please advise.

## 2022-09-15 NOTE — Progress Notes (Signed)
Patient was working for EGD/colonoscopy today. Unfortunately, her stool studies came back positive for C. Difficile today. As per [facility] policy, hold off on EGD/colonoscopy  Plan: Vancomycin 125 mg p.o. 4 times daily x 10 days.  Have called prescription into [facility] [address].  I have personally talked to pharmacist Porshe.  Discussed in detail with the patient and patient's mother  Can use Tylenol as needed for pain.  Avoid narcotics.  Follow-up in 2 to 3 weeks.  RG

## 2022-09-16 ENCOUNTER — Telehealth: Payer: Self-pay

## 2022-09-16 NOTE — Telephone Encounter (Signed)
Documentation under result notes: Pt aware: Pt verbalized understanding with all questions answered.

## 2022-09-16 NOTE — Telephone Encounter (Signed)
Chart reviewed and noted the Following Documentation:   Result Care Coordination   Result Notes   Gillermina Hu, RN 09/15/2022  2:27 PM EST Back to Top    Pt made aware of recent results and Dr. Lyndel Safe recommendations: Pt made aware of prescription: at [facility] [place] [place]) Pt scheduled for a follow up appointment on 10/10/2022 at 8:50 AM with Dr. Lyndel Safe: Pt made aware: Pt verbalized understanding with all questions answered.   Jackquline Denmark, MD 09/15/2022  1:48 PM EST     I have called in vancomycin 125 4 times daily x 10 days at [facility] ([address]) And use Tylenol for pain Avoid narcotics Avoid other antibiotics   Lelan Pons wants her to follow-up in the GI clinic in 2 to 3 weeks Can you make appointment in my clinic or APP clinic please EGD/colonoscopy only after follow-up, once C. difficile has cleared. RG

## 2022-09-24 ENCOUNTER — Ambulatory Visit: Payer: BC Managed Care – PPO | Admitting: Gastroenterology

## 2022-09-30 ENCOUNTER — Other Ambulatory Visit: Payer: Self-pay

## 2022-09-30 ENCOUNTER — Telehealth: Payer: Self-pay | Admitting: Gastroenterology

## 2022-09-30 DIAGNOSIS — D509 Iron deficiency anemia, unspecified: Secondary | ICD-10-CM

## 2022-09-30 DIAGNOSIS — E538 Deficiency of other specified B group vitamins: Secondary | ICD-10-CM

## 2022-09-30 DIAGNOSIS — R9389 Abnormal findings on diagnostic imaging of other specified body structures: Secondary | ICD-10-CM

## 2022-09-30 DIAGNOSIS — K625 Hemorrhage of anus and rectum: Secondary | ICD-10-CM

## 2022-09-30 DIAGNOSIS — R109 Unspecified abdominal pain: Secondary | ICD-10-CM

## 2022-09-30 MED ORDER — POLYETHYLENE GLYCOL 3350 17 GM/SCOOP PO POWD: 1.0000 | ORAL | 0 refills | Status: AC

## 2022-09-30 NOTE — Telephone Encounter (Signed)
Also received My Chart message from patient regarding this appointment.  She said this was rescheduled from what was originally a double procedure for the 11th.  Please call patient and advise.  Thank you.

## 2022-09-30 NOTE — Telephone Encounter (Signed)
Patient's mother called stating she did not know pt was not scheduled for a procedure. She was aware her daughter tested positive for C diff however wanted to know if the follow up was necessary. They wanted to proceed with procedure instead of office visit. Please advise.

## 2022-09-30 NOTE — Telephone Encounter (Signed)
Lets see her in the office first Then would set her up for endoscopic procedures only if she still has problems RG

## 2022-09-30 NOTE — Telephone Encounter (Signed)
Pt mom mary called in and stated that she seen on her daughter Heather Chart portal that she is scheduled for an office visit on 10/10/2021 where as she thought it was supposed to be a Procedure: Chart reviewed and noted the following documentation:  Jackquline Goodman, Heather Goodman      I have called in vancomycin 125 4 times daily x 10 days at [facility] And use Tylenol for pain Avoid narcotics Avoid other antibiotics   Lelan Pons wants her to follow-up in the GI clinic in 2 to 3 weeks Can you make appointment in Heather clinic or APP clinic please EGD/colonoscopy only after follow-up, once C. difficile has cleared. RG    Pt mom [name] was made aware of previous documentation: [name] requested that we go ahead and get Heather Goodman on the schedule for the actually procedure: Pt was scheduled for 11/28/2022 at 3:00 PM with Dr. Lyndel Safe: Ambulatory referral to GI placed:  Prep sent to pharmacy, Prep instructions were sent to pt via Heather chart: Pt and Pt mom made aware Pt Mom  is requesting that [place] get retested for C diff. Willye stated that she only had 1 episode of diarrhea before Christmas: [name] was notified that Dr. Lyndel Safe is out of the office the rest of the week and this is something that can be ordered at the time of the office visit on 10/10/2022. Mary requested that Dr. Lyndel Safe be sent a note and requested that she be tested prior to office visit: Please advise

## 2022-10-01 NOTE — Telephone Encounter (Signed)
Left message for pt mom [name] to call back:

## 2022-10-01 NOTE — Telephone Encounter (Signed)
Spoke with pt mom Heather Goodman. Heather Goodman was notified of Dr. Lyndel Safe recommendations: Heather Goodman  verbalized understanding with all questions answered.

## 2022-10-10 ENCOUNTER — Ambulatory Visit (INDEPENDENT_AMBULATORY_CARE_PROVIDER_SITE_OTHER): Payer: BC Managed Care – PPO | Admitting: Gastroenterology

## 2022-10-10 ENCOUNTER — Other Ambulatory Visit (INDEPENDENT_AMBULATORY_CARE_PROVIDER_SITE_OTHER): Payer: BC Managed Care – PPO

## 2022-10-10 ENCOUNTER — Encounter: Payer: Self-pay | Admitting: Gastroenterology

## 2022-10-10 VITALS — BP 110/70 | HR 77 | Ht 68.0 in | Wt 219.0 lb

## 2022-10-10 DIAGNOSIS — D509 Iron deficiency anemia, unspecified: Secondary | ICD-10-CM

## 2022-10-10 DIAGNOSIS — K582 Mixed irritable bowel syndrome: Secondary | ICD-10-CM

## 2022-10-10 DIAGNOSIS — E538 Deficiency of other specified B group vitamins: Secondary | ICD-10-CM

## 2022-10-10 DIAGNOSIS — A498 Other bacterial infections of unspecified site: Secondary | ICD-10-CM | POA: Diagnosis not present

## 2022-10-10 LAB — CBC WITH DIFFERENTIAL/PLATELET
Basophils Absolute: 0 10*3/uL (ref 0.0–0.1)
Basophils Relative: 0.5 % (ref 0.0–3.0)
Eosinophils Absolute: 0.1 10*3/uL (ref 0.0–0.7)
Eosinophils Relative: 1.6 % (ref 0.0–5.0)
HCT: 32.2 % — ABNORMAL LOW (ref 36.0–46.0)
Hemoglobin: 10.3 g/dL — ABNORMAL LOW (ref 12.0–15.0)
Lymphocytes Relative: 26.5 % (ref 12.0–46.0)
Lymphs Abs: 1.6 10*3/uL (ref 0.7–4.0)
MCHC: 32 g/dL (ref 30.0–36.0)
MCV: 69.4 fl — ABNORMAL LOW (ref 78.0–100.0)
Monocytes Absolute: 0.5 10*3/uL (ref 0.1–1.0)
Monocytes Relative: 7.7 % (ref 3.0–12.0)
Neutro Abs: 3.8 10*3/uL (ref 1.4–7.7)
Neutrophils Relative %: 63.7 % (ref 43.0–77.0)
Platelets: 374 10*3/uL (ref 150.0–400.0)
RBC: 4.63 Mil/uL (ref 3.87–5.11)
RDW: 20.5 % — ABNORMAL HIGH (ref 11.5–15.5)
WBC: 5.9 10*3/uL (ref 4.0–10.5)

## 2022-10-10 LAB — COMPREHENSIVE METABOLIC PANEL
ALT: 12 U/L (ref 0–35)
AST: 18 U/L (ref 0–37)
Albumin: 4.3 g/dL (ref 3.5–5.2)
Alkaline Phosphatase: 43 U/L (ref 39–117)
BUN: 7 mg/dL (ref 6–23)
CO2: 25 mEq/L (ref 19–32)
Calcium: 9.2 mg/dL (ref 8.4–10.5)
Chloride: 107 mEq/L (ref 96–112)
Creatinine, Ser: 0.76 mg/dL (ref 0.40–1.20)
GFR: 112.26 mL/min (ref >60.00)
Glucose, Bld: 96 mg/dL (ref 70–99)
Potassium: 4.2 mEq/L (ref 3.5–5.1)
Sodium: 137 mEq/L (ref 135–145)
Total Bilirubin: 0.4 mg/dL (ref 0.2–1.2)
Total Protein: 7.4 g/dL (ref 6.0–8.3)

## 2022-10-10 LAB — IBC + FERRITIN
Ferritin: 3.3 ng/mL — ABNORMAL LOW (ref 10.0–291.0)
Iron: 26 ug/dL — ABNORMAL LOW (ref 42–145)
Saturation Ratios: 4.9 % — ABNORMAL LOW (ref 20.0–50.0)
TIBC: 534.8 ug/dL — ABNORMAL HIGH (ref 250.0–450.0)
Transferrin: 382 mg/dL — ABNORMAL HIGH (ref 212.0–360.0)

## 2022-10-10 LAB — VITAMIN B12: Vitamin B-12: 169 pg/mL — ABNORMAL LOW (ref 211–911)

## 2022-10-10 LAB — C-REACTIVE PROTEIN: CRP: <1 mg/dL (ref 0.5–20.0)

## 2022-10-10 LAB — SEDIMENTATION RATE: Sed Rate: 28 mm/hr — ABNORMAL HIGH (ref 0–20)

## 2022-10-10 NOTE — Patient Instructions (Addendum)
Your provider has requested that you go to the basement level for lab work before leaving today. Press "B" on the elevator. The lab is located at the first door on the left as you exit the elevator.   Keep your scheduled appointment for EGD/Colon on 11/28/22.  If you are age 22 or older, your body mass index should be between 23-30. Your Body mass index is 33.3 kg/m. If this is out of the aforementioned range listed, please consider follow up with your Primary Care Provider.  If you are age 68 or younger, your body mass index should be between 19-25. Your Body mass index is 33.3 kg/m. If this is out of the aformentioned range listed, please consider follow up with your Primary Care Provider.   __________________________________________________________  The [place] [facility] providers would like to encourage you to use [facility] to communicate with providers for non-urgent requests or questions.  Due to long hold times on the telephone, sending your provider a message by [facility] may be a faster and more efficient way to get a response.  Please allow 48 business hours for a response.  Please remember that this is for non-urgent requests.   Due to recent changes in healthcare laws, you may see the results of your imaging and laboratory studies on MyChart before your provider has had a chance to review them.  We understand that in some cases there may be results that are confusing or concerning to you. Not all laboratory results come back in the same time frame and the provider may be waiting for multiple results in order to interpret others.  Please give [place] 48 hours in order for your provider to thoroughly review all the results before contacting the office for clarification of your results.    It was a pleasure to see you today!  Jackquline Denmark, M.D.

## 2022-10-10 NOTE — Progress Notes (Addendum)
Chief Complaint: FU  Referring Provider:  Buckley, [place] J, PA-C      ASSESSMENT AND PLAN;   #1. IBS-alt diarrhea/constipation.  Intermittent rectal bleeding.  Neg recent CT AP 09/2022.  #2. Recent C Diff colitis 09/09/2022.  Treated with vancomycin  #4. Postprandial nausea.  Vomiting has resolved.  #3. IDA/vit B12 deficiency. ?Etiology.  Plan: -Recheck stool studies for C. difficile to ensure eradication. Need neg test for [facility] procedures per guidelines. -CBC, CMP, sed rate, CRP, Vit B12, celiac, iron studies  -Keep appt for EGD/colon -Continue iron/B12 supplements for now -Hematology appt thereafter, if still with anemia/problems. -D/w pt and mom in detail.     HPI:    Heather Goodman is a 22 y.o. female  With PCKD, IDA, B12 def Accompanied by her mom.  For FU   Stool studies were positive for C. Difficile 12/5.  EGD/colon rescheduled.  Treated with p.o. vancomycin 125 QID x 10 days.  With good but not 100% relief.  She overall feels better  Still with heartburn, postprandial nausea without vomiting.  Still has postprandial abdo bloating  Alternating diarrhea and constipation.  Bright red blood but away from the stool.  She brings in pictures.  Abdominal pain is much better.  No further weight loss.  Appetite is better.  Wt Readings from Last 3 Encounters:  10/10/22 219 lb (99.3 kg)  09/09/22 215 lb (97.5 kg)  09/08/22 215 lb (97.5 kg)    No menstrual cycles since sept 2023 d/t Mirena.  Underwent CT Abdo/pelvis on 09/09/2022 which was unremarkable.  For additional details, see previous note.   Past GI WU:  CT Abdo/pelvis with contrast 09/09/2022 -Polycystic kidney disease -No evidence of Crohn's enteritis.  No acute findings.  CT AP with contrast 08/18/2022 1. Possible thickening of left upper quadrant jejunal loops which may be secondary to enteritis of infectious or inflammatory etiology. 2. Polycystic kidneys. Small pericardial  effusion     CT Abdo/pelvis with contrast 01/2021 1. Small volume pelvic fluid which could be physiologic. 2. Polycystic kidneys  Past Medical History:  Diagnosis Date   Ovarian cyst    Polycystic kidney disease, childhood type (CPKD)     History reviewed. No pertinent surgical history.  Family History  Problem Relation Age of Onset   Kidney disease Mother    Kidney disease Sister    Crohn's disease Paternal Aunt    Stomach cancer Neg Hx    Colon cancer Neg Hx    Esophageal cancer Neg Hx     Social History   Tobacco Use   Smoking status: Never   Smokeless tobacco: Never  Vaping Use   Vaping Use: Never used  Substance Use Topics   Alcohol use: Never   Drug use: Never    Current Outpatient Medications  Medication Sig Dispense Refill   acetaminophen (TYLENOL) 325 MG tablet Take 650 mg by mouth every 6 (six) hours as needed for moderate pain or headache.     cyanocobalamin (VITAMIN B12) 1000 MCG/ML injection Inject 38m every 14 days for 1 month and then 166mmonthly 14 mL 0   dicyclomine (BENTYL) 20 MG tablet Take 1 tablet (20 mg total) by mouth 2 (two) times daily. 20 tablet 0   ferrous sulfate 325 (65 FE) MG tablet Take 1 tablet (325 mg total) by mouth daily. 30 tablet 0   levonorgestrel (MIRENA, 52 MG,) 20 MCG/24HR IUD 1 Intra Uterine Device by Intrauterine route.     Multiple Vitamins-Minerals (ONE-A-DAY  WOMENS) tablet Take 1 tablet by mouth daily.     nifedipine 0.3 % ointment Place 1 Application rectally 4 (four) times daily. 30 g 0   ondansetron (ZOFRAN) 4 MG tablet Take 1 tablet (4 mg total) by mouth every 8 (eight) hours as needed for nausea or vomiting. 20 tablet 0   phenazopyridine (PYRIDIUM) 200 MG tablet Take 1 tablet (200 mg total) by mouth 3 (three) times daily as needed for pain. 6 tablet 0   polyethylene glycol powder (GLYCOLAX/MIRALAX) 17 GM/SCOOP powder Take 255 g by mouth as directed. 255 g 0   Vitamin D, Ergocalciferol, (DRISDOL) 1.25 MG (50000 UNIT)  CAPS capsule Take 50,000 Units by mouth every Wednesday.     No current facility-administered medications for this visit.    Allergies  Allergen Reactions   Other Rash   Aspirin Other (See Comments)    Other reaction(s): Unknown   Bergera Koenigii Swelling   Latex Rash    Review of Systems:  neg     Physical Exam:    BP 110/70   Pulse 77   Ht '5\' 8"'$  (1.727 m)   Wt 219 lb (99.3 kg)   SpO2 99%   BMI 33.30 kg/m  Wt Readings from Last 3 Encounters:  10/10/22 219 lb (99.3 kg)  09/09/22 215 lb (97.5 kg)  09/08/22 215 lb (97.5 kg)   Constitutional:  Well-developed, in no acute distress. Psychiatric: Normal mood and affect. Behavior is normal. HEENT: Pupils normal.  Conjunctivae are normal. No scleral icterus. Cardiovascular: Normal rate, regular rhythm. No edema Pulmonary/chest: Effort normal and breath sounds normal. No wheezing, rales or rhonchi. Abdominal: Soft, nondistended. Nontender. Bowel sounds active throughout. There are no masses palpable. No hepatomegaly. Rectal: Deferred Neurological: Alert and oriented to person place and time. Skin: Skin is warm and dry. No rashes noted.  Data Reviewed: I have personally reviewed following labs and imaging studies  CBC:    Latest Ref Rng & Units 09/08/2022   11:06 PM 08/18/2022    5:57 PM 01/21/2021    3:00 AM  CBC  WBC 4.0 - 10.5 K/uL 6.8  7.1  7.6   Hemoglobin 12.0 - 15.0 g/dL 9.6  9.3  12.1   Hematocrit 36.0 - 46.0 % 32.1  31.0  36.2   Platelets 150 - 400 K/uL 352  379  261     CMP:    Latest Ref Rng & Units 09/08/2022   11:06 PM 08/18/2022    5:57 PM 01/21/2021    3:00 AM  CMP  Glucose 70 - 99 mg/dL 105  97  90   BUN 6 - 20 mg/dL '8  9  8   '$ Creatinine 0.44 - 1.00 mg/dL 0.79  0.77  0.82   Sodium 135 - 145 mmol/L 138  139  138   Potassium 3.5 - 5.1 mmol/L 3.7  3.5  3.6   Chloride 98 - 111 mmol/L 110  109  108   CO2 22 - 32 mmol/L '21  25  20   '$ Calcium 8.9 - 10.3 mg/dL 8.4  8.4  8.8   Total Protein 6.5 - 8.1  g/dL 7.0  8.1  6.3   Total Bilirubin 0.3 - 1.2 mg/dL 0.3  0.5  0.5   Alkaline Phos 38 - 126 U/L 41  47  41   AST 15 - 41 U/L '16  16  15   '$ ALT 0 - 44 U/L 11  11  11  Carmell Austria, MD 10/10/2022, 9:09 AM  Cc: Buckley, [place] J, PA-C

## 2022-10-13 ENCOUNTER — Other Ambulatory Visit: Payer: Self-pay

## 2022-10-13 DIAGNOSIS — E538 Deficiency of other specified B group vitamins: Secondary | ICD-10-CM

## 2022-10-13 DIAGNOSIS — D509 Iron deficiency anemia, unspecified: Secondary | ICD-10-CM

## 2022-10-15 NOTE — Addendum Note (Signed)
Addended by: Howell Pringle on: 10/15/2022 02:49 PM   Modules accepted: Orders

## 2022-10-21 ENCOUNTER — Encounter: Payer: BC Managed Care – PPO | Admitting: Gastroenterology

## 2022-10-23 ENCOUNTER — Telehealth (HOSPITAL_COMMUNITY): Payer: Self-pay

## 2022-10-24 ENCOUNTER — Inpatient Hospital Stay: Payer: BC Managed Care – PPO

## 2022-10-24 ENCOUNTER — Telehealth: Payer: Self-pay

## 2022-10-24 ENCOUNTER — Inpatient Hospital Stay (HOSPITAL_BASED_OUTPATIENT_CLINIC_OR_DEPARTMENT_OTHER): Payer: BC Managed Care – PPO | Admitting: Oncology

## 2022-10-24 ENCOUNTER — Inpatient Hospital Stay: Payer: BC Managed Care – PPO | Attending: Oncology

## 2022-10-24 ENCOUNTER — Encounter: Payer: Self-pay | Admitting: Oncology

## 2022-10-24 VITALS — BP 117/97 | HR 70 | Temp 98.4°F | Resp 20 | Wt 222.9 lb

## 2022-10-24 DIAGNOSIS — I3139 Other pericardial effusion (noninflammatory): Secondary | ICD-10-CM | POA: Diagnosis not present

## 2022-10-24 DIAGNOSIS — N938 Other specified abnormal uterine and vaginal bleeding: Secondary | ICD-10-CM | POA: Insufficient documentation

## 2022-10-24 DIAGNOSIS — Z79899 Other long term (current) drug therapy: Secondary | ICD-10-CM | POA: Diagnosis not present

## 2022-10-24 DIAGNOSIS — D539 Nutritional anemia, unspecified: Secondary | ICD-10-CM

## 2022-10-24 DIAGNOSIS — R0609 Other forms of dyspnea: Secondary | ICD-10-CM | POA: Diagnosis not present

## 2022-10-24 DIAGNOSIS — R231 Pallor: Secondary | ICD-10-CM

## 2022-10-24 DIAGNOSIS — Z8349 Family history of other endocrine, nutritional and metabolic diseases: Secondary | ICD-10-CM | POA: Insufficient documentation

## 2022-10-24 DIAGNOSIS — Z8249 Family history of ischemic heart disease and other diseases of the circulatory system: Secondary | ICD-10-CM | POA: Diagnosis not present

## 2022-10-24 DIAGNOSIS — A0472 Enterocolitis due to Clostridium difficile, not specified as recurrent: Secondary | ICD-10-CM | POA: Insufficient documentation

## 2022-10-24 DIAGNOSIS — Z8619 Personal history of other infectious and parasitic diseases: Secondary | ICD-10-CM | POA: Diagnosis not present

## 2022-10-24 DIAGNOSIS — F5089 Other specified eating disorder: Secondary | ICD-10-CM

## 2022-10-24 DIAGNOSIS — Z8379 Family history of other diseases of the digestive system: Secondary | ICD-10-CM

## 2022-10-24 DIAGNOSIS — Z886 Allergy status to analgesic agent status: Secondary | ICD-10-CM

## 2022-10-24 DIAGNOSIS — Q613 Polycystic kidney, unspecified: Secondary | ICD-10-CM | POA: Diagnosis not present

## 2022-10-24 DIAGNOSIS — E538 Deficiency of other specified B group vitamins: Secondary | ICD-10-CM | POA: Insufficient documentation

## 2022-10-24 DIAGNOSIS — R5383 Other fatigue: Secondary | ICD-10-CM | POA: Insufficient documentation

## 2022-10-24 DIAGNOSIS — Z833 Family history of diabetes mellitus: Secondary | ICD-10-CM | POA: Insufficient documentation

## 2022-10-24 DIAGNOSIS — Z841 Family history of disorders of kidney and ureter: Secondary | ICD-10-CM

## 2022-10-24 DIAGNOSIS — D509 Iron deficiency anemia, unspecified: Secondary | ICD-10-CM

## 2022-10-24 DIAGNOSIS — K5900 Constipation, unspecified: Secondary | ICD-10-CM | POA: Diagnosis not present

## 2022-10-24 DIAGNOSIS — R232 Flushing: Secondary | ICD-10-CM | POA: Insufficient documentation

## 2022-10-24 DIAGNOSIS — N920 Excessive and frequent menstruation with regular cycle: Secondary | ICD-10-CM | POA: Diagnosis not present

## 2022-10-24 LAB — CBC WITH DIFFERENTIAL/PLATELET
Abs Immature Granulocytes: 0.02 10*3/uL (ref 0.00–0.07)
Basophils Absolute: 0 10*3/uL (ref 0.0–0.1)
Basophils Relative: 1 %
Eosinophils Absolute: 0 10*3/uL (ref 0.0–0.5)
Eosinophils Relative: 0 %
HCT: 32.5 % — ABNORMAL LOW (ref 36.0–46.0)
Hemoglobin: 10.1 g/dL — ABNORMAL LOW (ref 12.0–15.0)
Immature Granulocytes: 0 %
Lymphocytes Relative: 23 %
Lymphs Abs: 1.2 10*3/uL (ref 0.7–4.0)
MCH: 22.4 pg — ABNORMAL LOW (ref 26.0–34.0)
MCHC: 31.1 g/dL (ref 30.0–36.0)
MCV: 72.2 fL — ABNORMAL LOW (ref 80.0–100.0)
Monocytes Absolute: 0.3 10*3/uL (ref 0.1–1.0)
Monocytes Relative: 5 %
Neutro Abs: 3.6 10*3/uL (ref 1.7–7.7)
Neutrophils Relative %: 71 %
Platelets: 323 10*3/uL (ref 150–400)
RBC: 4.5 MIL/uL (ref 3.87–5.11)
RDW: 20 % — ABNORMAL HIGH (ref 11.5–15.5)
WBC: 5.1 10*3/uL (ref 4.0–10.5)
nRBC: 0 % (ref 0.0–0.2)

## 2022-10-24 LAB — PROTIME-INR
INR: 1 (ref 0.8–1.2)
Prothrombin Time: 13.2 seconds (ref 11.4–15.2)

## 2022-10-24 LAB — APTT: aPTT: 31 seconds (ref 24–36)

## 2022-10-24 LAB — DIRECT ANTIGLOBULIN TEST (NOT AT ARMC)
DAT, IgG: NEGATIVE
DAT, complement: NEGATIVE

## 2022-10-24 LAB — FIBRINOGEN: Fibrinogen: 365 mg/dL (ref 210–475)

## 2022-10-24 LAB — FOLATE: Folate: 11.2 ng/mL (ref >5.9)

## 2022-10-24 MED ORDER — CYANOCOBALAMIN 1000 MCG/ML IJ SOLN
1000.0000 ug | Freq: Once | INTRAMUSCULAR | Status: AC
  Administered 2022-10-24: 1000 ug via INTRAMUSCULAR
  Filled 2022-10-24: qty 1

## 2022-10-24 NOTE — Telephone Encounter (Signed)
The C Diff lab came back as being negative. Called and spoke to mom and patient about this and they are aware

## 2022-10-24 NOTE — Patient Instructions (Signed)
Vitamin B12 Deficiency Vitamin B12 deficiency occurs when the body does not have enough of this important vitamin. The body needs this vitamin: To make red blood cells. To make DNA. This is the genetic material inside cells. To help the nerves work properly so they can carry messages from the brain to the body. Vitamin B12 deficiency can cause health problems, such as not having enough red blood cells in the blood (anemia). This can lead to nerve damage if untreated. What are the causes? This condition may be caused by: Not eating enough foods that contain vitamin B12. Not having enough stomach acid and digestive fluids to properly absorb vitamin B12 from the food that you eat. Having certain diseases that make it hard to absorb vitamin B12. These diseases include Crohn's disease, chronic pancreatitis, and cystic fibrosis. An autoimmune disorder in which the body does not make enough of a protein (intrinsic factor) within the stomach, resulting in not enough absorption of vitamin B12. Having a surgery in which part of the stomach or small intestine is removed. Taking certain medicines that make it hard for the body to absorb vitamin B12. These include: Heartburn medicines, such as antacids and proton pump inhibitors. Some medicines that are used to treat diabetes. What increases the risk? The following factors may make you more likely to develop a vitamin B12 deficiency: Being an older adult. Eating a vegetarian or vegan diet that does not include any foods that come from animals. Eating a poor diet while you are pregnant. Taking certain medicines. Having alcoholism. What are the signs or symptoms? In some cases, there are no symptoms of this condition. If the condition leads to anemia or nerve damage, various symptoms may occur, such as: Weakness. Tiredness (fatigue). Loss of appetite. Numbness or tingling in your hands and feet. Redness and burning of the tongue. Depression,  confusion, or memory problems. Trouble walking. If anemia is severe, symptoms can include: Shortness of breath. Dizziness. Rapid heart rate. How is this diagnosed? This condition may be diagnosed with a blood test to measure the level of vitamin B12 in your blood. You may also have other tests, including: A group of tests that measure certain characteristics of blood cells (complete blood count, CBC). A blood test to measure intrinsic factor. A procedure where a thin tube with a camera on the end is used to look into your stomach or intestines (endoscopy). Other tests may be needed to discover the cause of the deficiency. How is this treated? Treatment for this condition depends on the cause. This condition may be treated by: Changing your eating and drinking habits, such as: Eating more foods that contain vitamin B12. Drinking less alcohol or no alcohol. Getting vitamin B12 injections. Taking vitamin B12 supplements by mouth (orally). Your health care provider will tell you which dose is best for you. Follow these instructions at home: Eating and drinking  Include foods in your diet that come from animals and contain a lot of vitamin B12. These include: Meats and poultry. This includes beef, pork, chicken, turkey, and organ meats, such as liver. Seafood. This includes clams, rainbow trout, salmon, tuna, and haddock. Eggs. Dairy foods such as milk, yogurt, and cheese. Eat foods that have vitamin B12 added to them (are fortified), such as ready-to-eat breakfast cereals. Check the label on the package to see if a food is fortified. The items listed above may not be a complete list of foods and beverages you can eat and drink. Contact a dietitian for   more information. Alcohol use Do not drink alcohol if: Your health care provider tells you not to drink. You are pregnant, may be pregnant, or are planning to become pregnant. If you drink alcohol: Limit how much you have to: 0-1 drink a  day for women. 0-2 drinks a day for men. Know how much alcohol is in your drink. In the [place] one drink equals one 12 oz bottle of beer (355 mL), one 5 oz glass of wine (148 mL), or one 1 oz glass of hard liquor (44 mL). General instructions Get vitamin B12 injections if told to by your health care provider. Take supplements only as told by your health care provider. Follow the directions carefully. Keep all follow-up visits. This is important. Contact a health care provider if: Your symptoms come back. Your symptoms get worse or do not improve with treatment. Get help right away: You develop shortness of breath. You have a rapid heart rate. You have chest pain. You become dizzy or you faint. These symptoms may be an emergency. Get help right away. Call 911. Do not wait to see if the symptoms will go away. Do not drive yourself to the hospital. Summary Vitamin B12 deficiency occurs when the body does not have enough of this important vitamin. Common causes include not eating enough foods that contain vitamin B12, not being able to absorb vitamin B12 from the food that you eat, having a surgery in which part of the stomach or small intestine is removed, or taking certain medicines. Eat foods that have vitamin B12 in them. Treatment may include making a change in the way you eat and drink, getting vitamin B12 injections, or taking vitamin B12 supplements. This information is not intended to replace advice given to you by your health care provider. Make sure you discuss any questions you have with your health care provider. Document Revised: 05/17/2021 Document Reviewed: 05/17/2021 Elsevier Patient Education  2023 [employer]  

## 2022-10-24 NOTE — Progress Notes (Signed)
[facility] Initial Visit:  Patient Care Team: Buckley, [place] J, PA-C as PCP - General (Physician Assistant)  CHIEF COMPLAINTS/PURPOSE OF CONSULTATION:  Oncology History   No history exists.    HISTORY OF PRESENTING ILLNESS: Heather Goodman 22 y.o. female is here because of  anemia August 18, 2022: CT abdomen pelvis Possible thickening of left upper quadrant jejunal loops which may be secondary to enteritis of infectious or inflammatory etiology.  Polycystic kidneys. Small pericardial effusion.  August 18, 2022:  Folate 8 B12 109  September 09, 2022:   Stool for C. difficile positive.  Patient had not been on Abx prior to the diagnosis but had been undergoing CNA training  CT abdomen pelvis uterus and adnexa unremarkable.  No mass.  No evidence of Crohn's enteritis  October 10, 2022: WBC 5.9 hemoglobin 9.3 MCV 69 platelet count 374; 64 segs 27 lymphs 8 monos 2 eos 1 basophil Sed rate 28 CRP undetectable B12 169 ferritin 3  October 24 2022:  Heather Goodman Hematology Consult  Patient is G0 P0 Menopause not reached.  Last period was June 04 2022, she is on Nexplanon.  Prior to being placed on contraception she had heavy menses which occurred monthly and lasted 1 week.  Periods were regular.  Did not usually have bleeding between periods.    Patient does not have history of uterine fibroids/uterine abnormalities.   Was placed on oral iron at 89 yrs of age but was not consistent with it.  Has received PRBC's on 2 occasions.  Has never received IV iron.  Develops constipation with oral iron.     Has a normal diet.  No history of hemorrhage following loss deciduous teeth.  Has hematochezia but does not pass mucus.  No history of intra-articular or soft tissue bleeding No history of abnormal bleeding in family members except for fibroids and heavy menses in female family members.  Patient has symptoms of fatigue, pallor,  DOE, decreased performance status,  intolerance to cold.  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  Tobacco none.  EtOH rare  [facility] Mother alive 48 DM Type II, fibroids, HTN Father alive 58 obesity Sisters alive 55 and 24 with polycystic kidney disease Brother alive 34 well  Review of Systems - Oncology  MEDICAL HISTORY: Past Medical History:  Diagnosis Date   Ovarian cyst    Polycystic kidney disease, childhood type (CPKD)     SURGICAL HISTORY: History reviewed. No pertinent surgical history.  SOCIAL HISTORY: Social History   Socioeconomic History   Marital status: Single    Spouse name: Not on file   Number of children: 0   Years of education: Not on file   Highest education level: Not on file  Occupational History   Occupation: student  Tobacco Use   Smoking status: Never   Smokeless tobacco: Never  Vaping Use   Vaping Use: Never used  Substance and Sexual Activity   Alcohol use: Never   Drug use: Never   Sexual activity: Yes    Birth control/protection: Implant  Other Topics Concern   Not on file  Social History Narrative   Not on file   Social Determinants of Health   Financial Resource Strain: Not on file  Food Insecurity: Not on file  Transportation Needs: Not on file  Physical Activity: Not on file  Stress: Not on file  Social Connections: Not on file  Intimate Partner Violence: Not on file    FAMILY HISTORY Family History  Problem Relation Age of Onset   Kidney disease Mother    Kidney disease Sister    Crohn's disease Paternal Aunt    Stomach cancer Neg Hx    Colon cancer Neg Hx    Esophageal cancer Neg Hx     ALLERGIES:  is allergic to other, aspirin, bergera koenigii, and latex.  MEDICATIONS:  Current Outpatient Medications  Medication Sig Dispense Refill   acetaminophen (TYLENOL) 325 MG tablet Take 650 mg by mouth every 6 (six) hours as needed for moderate pain or headache.     dicyclomine (BENTYL)  20 MG tablet Take 1 tablet (20 mg total) by mouth 2 (two) times daily. 20 tablet 0   ferrous sulfate 325 (65 FE) MG tablet Take 1 tablet (325 mg total) by mouth daily. 30 tablet 0   Multiple Vitamins-Minerals (ONE-A-DAY WOMENS) tablet Take 1 tablet by mouth daily.     nifedipine 0.3 % ointment Place 1 Application rectally 4 (four) times daily. 30 g 0   ondansetron (ZOFRAN) 4 MG tablet Take 1 tablet (4 mg total) by mouth every 8 (eight) hours as needed for nausea or vomiting. 20 tablet 0   phentermine (ADIPEX-P) 37.5 MG tablet Take 37.5 mg by mouth every morning.     polyethylene glycol powder (GLYCOLAX/MIRALAX) 17 GM/SCOOP powder Take 255 g by mouth as directed. 255 g 0   valACYclovir (VALTREX) 500 MG tablet Take 500 mg by mouth daily.     Vitamin D, Ergocalciferol, (DRISDOL) 1.25 MG (50000 UNIT) CAPS capsule Take 50,000 Units by mouth every Wednesday.     cyanocobalamin (VITAMIN B12) 1000 MCG/ML injection Inject 16m every 14 days for 1 month and then 169mmonthly (Patient not taking: Reported on 10/24/2022) 14 mL 0   No current facility-administered medications for this visit.    PHYSICAL EXAMINATION:  ECOG PERFORMANCE STATUS: 1 - Symptomatic but completely ambulatory   Vitals:   10/24/22 0839  BP: (!) 117/97  Pulse: 70  Resp: 20  Temp: 98.4 F (36.9 C)  SpO2: 100%    Filed Weights   10/24/22 0839  Weight: 222 lb 14.4 oz (101.1 kg)     Physical Exam Vitals and nursing note reviewed.  Constitutional:      General: She is not in acute distress.    Appearance: Normal appearance. She is not ill-appearing, toxic-appearing or diaphoretic.     Comments: Here with mother and boyfriend  HENT:     Head: Normocephalic and atraumatic.     Right Ear: External ear normal.     Left Ear: External ear normal.     Nose: Nose normal. No congestion or rhinorrhea.  Eyes:     General: No scleral icterus.    Extraocular Movements: Extraocular movements intact.     Conjunctiva/sclera:  Conjunctivae normal.     Pupils: Pupils are equal, round, and reactive to light.  Cardiovascular:     Rate and Rhythm: Normal rate.     Heart sounds: Normal heart sounds. No murmur heard.    No friction rub. No gallop.  Pulmonary:     Effort: Pulmonary effort is normal. No respiratory distress.     Breath sounds: Normal breath sounds. No wheezing or rhonchi.  Abdominal:     General: Bowel sounds are normal.     Palpations: Abdomen is soft.     Tenderness: There is no abdominal tenderness. There is no guarding or rebound.  Musculoskeletal:        General: No swelling, tenderness or deformity.     Cervical back: Normal range of motion and neck supple. No rigidity or tenderness.  Lymphadenopathy:     Head:     Right side of head: No submental, submandibular, tonsillar, preauricular, posterior auricular or occipital adenopathy.     Left side of head: No submental, submandibular, tonsillar, preauricular, posterior auricular or occipital adenopathy.     Cervical: No cervical adenopathy.     Right cervical: No superficial, deep or posterior cervical adenopathy.    Left cervical: No superficial, deep or posterior cervical adenopathy.     Upper Body:     Right upper body: No supraclavicular, axillary, pectoral or epitrochlear adenopathy.     Left upper body: No supraclavicular, axillary, pectoral or epitrochlear adenopathy.  Skin:    General: Skin is warm.     Coloration: Skin is not jaundiced or pale.  Neurological:     General: No focal deficit present.     Mental Status: She is alert and oriented to person, place, and time.     Cranial Nerves: No cranial nerve deficit.  Psychiatric:        Mood and Affect: Mood normal.        Behavior: Behavior normal.        Thought Content: Thought content normal.        Judgment: Judgment normal.      LABORATORY DATA: I have personally reviewed the data as listed:  Appointment on 10/10/2022  Component Date Value Ref Range Status   Iron  10/10/2022 26 (L)  42 - 145 ug/dL Final   Transferrin 10/10/2022 382.0 (H)  212.0 - 360.0 mg/dL Final   Saturation Ratios 10/10/2022 4.9 (L)  20.0 - 50.0 % Final   Ferritin 10/10/2022 3.3 (L)  10.0 - 291.0 ng/mL Final   TIBC 10/10/2022 534.8 (H)  250.0 - 450.0 mcg/dL Final   Vitamin B-12 10/10/2022 169 (L)  211 - 911 pg/mL Final   CRP 10/10/2022 <1.0  0.5 - 20.0 mg/dL Final   Sed Rate 10/10/2022 28 (H)  0 - 20 mm/hr Final   Sodium 10/10/2022 137  135 - 145 mEq/L Final   Potassium 10/10/2022 4.2  3.5 - 5.1 mEq/L Final   Chloride 10/10/2022 107  96 - 112 mEq/L Final   CO2 10/10/2022 25  19 - 32 mEq/L Final   Glucose, Bld 10/10/2022 96  70 - 99 mg/dL Final   BUN 10/10/2022 7  6 - 23 mg/dL Final   Creatinine, Ser 10/10/2022 0.76  0.40 - 1.20 mg/dL Final   Total Bilirubin 10/10/2022 0.4  0.2 - 1.2 mg/dL Final   Alkaline Phosphatase 10/10/2022 43  39 - 117 U/L Final   AST 10/10/2022 18  0 - 37 U/L Final   ALT 10/10/2022 12  0 - 35 U/L Final   Total Protein 10/10/2022 7.4  6.0 - 8.3 g/dL Final   Albumin 10/10/2022 4.3  3.5 - 5.2 g/dL Final   GFR 10/10/2022 112.26  >60.00 mL/min Final   Calculated using the CKD-EPI Creatinine Equation (2021)   Calcium 10/10/2022 9.2  8.4 - 10.5 mg/dL Final   WBC 10/10/2022 5.9  4.0 - 10.5 K/uL Final   RBC 10/10/2022 4.63  3.87 - 5.11 Mil/uL Final   Hemoglobin 10/10/2022 10.3 (L)  12.0 - 15.0 g/dL Final   HCT 10/10/2022 32.2 (L)  36.0 - 46.0 % Final   MCV 10/10/2022 69.4 Repeated and verified  X2. (L)  78.0 - 100.0 fl Final   MCHC 10/10/2022 32.0  30.0 - 36.0 g/dL Final   RDW 10/10/2022 20.5 (H)  11.5 - 15.5 % Final   Platelets 10/10/2022 374.0  150.0 - 400.0 K/uL Final   Neutrophils Relative % 10/10/2022 63.7  43.0 - 77.0 % Final   Lymphocytes Relative 10/10/2022 26.5  12.0 - 46.0 % Final   Monocytes Relative 10/10/2022 7.7  3.0 - 12.0 % Final   Eosinophils Relative 10/10/2022 1.6  0.0 - 5.0 % Final   Basophils Relative 10/10/2022 0.5  0.0 - 3.0 % Final    Neutro Abs 10/10/2022 3.8  1.4 - 7.7 K/uL Final   Lymphs Abs 10/10/2022 1.6  0.7 - 4.0 K/uL Final   Monocytes Absolute 10/10/2022 0.5  0.1 - 1.0 K/uL Final   Eosinophils Absolute 10/10/2022 0.1  0.0 - 0.7 K/uL Final   Basophils Absolute 10/10/2022 0.0  0.0 - 0.1 K/uL Final    RADIOGRAPHIC STUDIES: I have personally reviewed the radiological images as listed and agree with the findings in the report  No results found.  ASSESSMENT/PLAN  Patient is a 22 year old female with symptomatic microcytic anemia who has a history of dysfunctional uterine bleeding.  Patient also has vitamin B12 deficiency and a recent history of C diff colitis  Anemia:  Most likely etiology is iron deficiency owing to prior dysfunctional uterine bleeding exacerbated by B12 deficiency.  Will obtain CBC with diff, folate, retic count,  DAT, Haptoglobin, Hemoglobin Cascade.  Agree with consideration of endoscopy by GI.  Would have anticipated that IDA would have resolved following cessation of menses.    Dysfunctional uterine bleeding:  This is characterized by menses that are prolonged, irregular as well as by heavy bleeding with passage of clots.  Will evaluate for possible bleeding disorder with PT, PTT, Fibrinogen, von Willebrand screen.  Currently on Nexplanon per gynecology with control of menses  Vitamin B12 deficiency:  Will check intrinsic factor blocking Ab and antiparietal cell Ab.  To start parenteral B12 replacement today  Therapeutics:  Since patient has symptomatic anemia with Hgb < 10  and has not tolerated/been compliant with oral iron will arrange for IV iron replacement.   Given the severe symptoms we prefer to replete iron stores in one or two visits rather than over the course of several months.  In addition ongoing blood loss exceeds the capacity of oral iron to meet needs. A discussion regarding risks was had with the patient.  IV iron has the potential to cause allergic reactions, including potentially  life-threatening anaphylaxis.   IV iron may be associated with non-allergic infusion reactions including self-limiting urticaria, palpitations, dizziness, and neck and back spasm; generally, these occur in <1 percent of individuals and do not progress to more serious reactions. The non-allergic reaction consisting of flushing of the face and myalgias of the chest and back.   After discussion of the risks and benefits of IV iron therapy patient has elected to proceed with parenteral iron therapy.    C diff colitis:  Likely acquired via occupational exposure.  Management per GI    Cancer Staging  No matching staging information was found for the patient.   No problem-specific Assessment & Plan notes found for this encounter.   No orders of the defined types were placed in this encounter.  62  minutes was spent in patient care.  This included time spent preparing to see the patient (e.g., review of tests), obtaining and/or  reviewing separately obtained history, counseling and educating the patient/family/caregiver, ordering medications, tests, or procedures; documenting clinical information in the electronic or other health record, independently interpreting results and communicating results to the patient/family/caregiver as well as coordination of care.       All questions were answered. The patient knows to call the clinic with any problems, questions or concerns.  This note was electronically signed.    Barbee Cough, MD  10/24/2022 9:00 AM

## 2022-10-25 ENCOUNTER — Ambulatory Visit: Payer: BC Managed Care – PPO

## 2022-10-25 ENCOUNTER — Inpatient Hospital Stay: Payer: BC Managed Care – PPO

## 2022-10-25 VITALS — BP 131/79 | HR 88 | Temp 97.7°F | Resp 14

## 2022-10-25 DIAGNOSIS — D539 Nutritional anemia, unspecified: Secondary | ICD-10-CM

## 2022-10-25 DIAGNOSIS — D509 Iron deficiency anemia, unspecified: Secondary | ICD-10-CM | POA: Diagnosis not present

## 2022-10-25 LAB — HAPTOGLOBIN: Haptoglobin: 96 mg/dL (ref 33–278)

## 2022-10-25 MED ORDER — CYANOCOBALAMIN 1000 MCG/ML IJ SOLN
1000.0000 ug | Freq: Once | INTRAMUSCULAR | Status: AC
  Administered 2022-10-25: 1000 ug via INTRAMUSCULAR
  Filled 2022-10-25: qty 1

## 2022-10-26 LAB — ANTI-PARIETAL ANTIBODY: Parietal Cell Antibody-IgG: 8 Units (ref 0.0–20.0)

## 2022-10-26 LAB — INTRINSIC FACTOR ANTIBODIES: Intrinsic Factor: 14.5 AU/mL — ABNORMAL HIGH (ref 0.0–1.1)

## 2022-10-27 ENCOUNTER — Other Ambulatory Visit: Payer: Self-pay

## 2022-10-27 ENCOUNTER — Inpatient Hospital Stay: Payer: BC Managed Care – PPO

## 2022-10-27 ENCOUNTER — Telehealth: Payer: Self-pay | Admitting: Oncology

## 2022-10-27 DIAGNOSIS — D509 Iron deficiency anemia, unspecified: Secondary | ICD-10-CM | POA: Diagnosis not present

## 2022-10-27 DIAGNOSIS — D539 Nutritional anemia, unspecified: Secondary | ICD-10-CM

## 2022-10-27 MED ORDER — CYANOCOBALAMIN 1000 MCG/ML IJ SOLN
1000.0000 ug | Freq: Once | INTRAMUSCULAR | Status: AC
  Administered 2022-10-27: 1000 ug via INTRAMUSCULAR
  Filled 2022-10-27: qty 1

## 2022-10-27 NOTE — Patient Instructions (Signed)
Vitamin B12 Deficiency Vitamin B12 deficiency occurs when the body does not have enough of this important vitamin. The body needs this vitamin: To make red blood cells. To make DNA. This is the genetic material inside cells. To help the nerves work properly so they can carry messages from the brain to the body. Vitamin B12 deficiency can cause health problems, such as not having enough red blood cells in the blood (anemia). This can lead to nerve damage if untreated. What are the causes? This condition may be caused by: Not eating enough foods that contain vitamin B12. Not having enough stomach acid and digestive fluids to properly absorb vitamin B12 from the food that you eat. Having certain diseases that make it hard to absorb vitamin B12. These diseases include Crohn's disease, chronic pancreatitis, and cystic fibrosis. An autoimmune disorder in which the body does not make enough of a protein (intrinsic factor) within the stomach, resulting in not enough absorption of vitamin B12. Having a surgery in which part of the stomach or small intestine is removed. Taking certain medicines that make it hard for the body to absorb vitamin B12. These include: Heartburn medicines, such as antacids and proton pump inhibitors. Some medicines that are used to treat diabetes. What increases the risk? The following factors may make you more likely to develop a vitamin B12 deficiency: Being an older adult. Eating a vegetarian or vegan diet that does not include any foods that come from animals. Eating a poor diet while you are pregnant. Taking certain medicines. Having alcoholism. What are the signs or symptoms? In some cases, there are no symptoms of this condition. If the condition leads to anemia or nerve damage, various symptoms may occur, such as: Weakness. Tiredness (fatigue). Loss of appetite. Numbness or tingling in your hands and feet. Redness and burning of the tongue. Depression,  confusion, or memory problems. Trouble walking. If anemia is severe, symptoms can include: Shortness of breath. Dizziness. Rapid heart rate. How is this diagnosed? This condition may be diagnosed with a blood test to measure the level of vitamin B12 in your blood. You may also have other tests, including: A group of tests that measure certain characteristics of blood cells (complete blood count, CBC). A blood test to measure intrinsic factor. A procedure where a thin tube with a camera on the end is used to look into your stomach or intestines (endoscopy). Other tests may be needed to discover the cause of the deficiency. How is this treated? Treatment for this condition depends on the cause. This condition may be treated by: Changing your eating and drinking habits, such as: Eating more foods that contain vitamin B12. Drinking less alcohol or no alcohol. Getting vitamin B12 injections. Taking vitamin B12 supplements by mouth (orally). Your health care provider will tell you which dose is best for you. Follow these instructions at home: Eating and drinking  Include foods in your diet that come from animals and contain a lot of vitamin B12. These include: Meats and poultry. This includes beef, pork, chicken, turkey, and organ meats, such as liver. Seafood. This includes clams, rainbow trout, salmon, tuna, and haddock. Eggs. Dairy foods such as milk, yogurt, and cheese. Eat foods that have vitamin B12 added to them (are fortified), such as ready-to-eat breakfast cereals. Check the label on the package to see if a food is fortified. The items listed above may not be a complete list of foods and beverages you can eat and drink. Contact a dietitian for   more information. Alcohol use Do not drink alcohol if: Your health care provider tells you not to drink. You are pregnant, may be pregnant, or are planning to become pregnant. If you drink alcohol: Limit how much you have to: 0-1 drink a  day for women. 0-2 drinks a day for men. Know how much alcohol is in your drink. In the [place] one drink equals one 12 oz bottle of beer (355 mL), one 5 oz glass of wine (148 mL), or one 1 oz glass of hard liquor (44 mL). General instructions Get vitamin B12 injections if told to by your health care provider. Take supplements only as told by your health care provider. Follow the directions carefully. Keep all follow-up visits. This is important. Contact a health care provider if: Your symptoms come back. Your symptoms get worse or do not improve with treatment. Get help right away: You develop shortness of breath. You have a rapid heart rate. You have chest pain. You become dizzy or you faint. These symptoms may be an emergency. Get help right away. Call 911. Do not wait to see if the symptoms will go away. Do not drive yourself to the hospital. Summary Vitamin B12 deficiency occurs when the body does not have enough of this important vitamin. Common causes include not eating enough foods that contain vitamin B12, not being able to absorb vitamin B12 from the food that you eat, having a surgery in which part of the stomach or small intestine is removed, or taking certain medicines. Eat foods that have vitamin B12 in them. Treatment may include making a change in the way you eat and drink, getting vitamin B12 injections, or taking vitamin B12 supplements. This information is not intended to replace advice given to you by your health care provider. Make sure you discuss any questions you have with your health care provider. Document Revised: 05/17/2021 Document Reviewed: 05/17/2021 Elsevier Patient Education  2023 [employer]  

## 2022-10-27 NOTE — Telephone Encounter (Signed)
Patient aware of upcoming appointments  

## 2022-10-28 ENCOUNTER — Inpatient Hospital Stay: Payer: BC Managed Care – PPO

## 2022-10-28 DIAGNOSIS — D509 Iron deficiency anemia, unspecified: Secondary | ICD-10-CM | POA: Diagnosis not present

## 2022-10-28 DIAGNOSIS — D539 Nutritional anemia, unspecified: Secondary | ICD-10-CM

## 2022-10-28 LAB — HGB FRACTIONATION CASCADE
Hgb A2: 1.3 % — ABNORMAL LOW (ref 1.8–3.2)
Hgb A: 98.7 % (ref 96.4–98.8)
Hgb F: 0 % (ref 0.0–2.0)
Hgb S: 0 %

## 2022-10-28 MED ORDER — CYANOCOBALAMIN 1000 MCG/ML IJ SOLN
1000.0000 ug | Freq: Once | INTRAMUSCULAR | Status: AC
  Administered 2022-10-28: 1000 ug via INTRAMUSCULAR
  Filled 2022-10-28: qty 1

## 2022-10-28 NOTE — Patient Instructions (Signed)

## 2022-10-29 ENCOUNTER — Encounter: Payer: Self-pay | Admitting: Gastroenterology

## 2022-10-29 ENCOUNTER — Other Ambulatory Visit: Payer: Self-pay

## 2022-10-29 ENCOUNTER — Inpatient Hospital Stay: Payer: BC Managed Care – PPO

## 2022-10-29 ENCOUNTER — Encounter: Payer: Self-pay | Admitting: Oncology

## 2022-10-29 DIAGNOSIS — D509 Iron deficiency anemia, unspecified: Secondary | ICD-10-CM | POA: Diagnosis not present

## 2022-10-29 DIAGNOSIS — D539 Nutritional anemia, unspecified: Secondary | ICD-10-CM

## 2022-10-29 MED ORDER — CYANOCOBALAMIN 1000 MCG/ML IJ SOLN
1000.0000 ug | Freq: Once | INTRAMUSCULAR | Status: AC
  Administered 2022-10-29: 1000 ug via INTRAMUSCULAR
  Filled 2022-10-29: qty 1

## 2022-10-29 NOTE — Patient Instructions (Signed)
Vitamin B12 Injection Quel est ce mdicament ? La vitamine B12 prvient et traite les faibles taux de vitamine B12 dans l'organisme. Elle est utilise [employer] prsentant un dficit en vitamine B12 en raison de Chemical engineer alimentation ou lorsqu'elle est insuffisamment absorbe par [employer] tube digestif. La vitamine B12 joue un rle important dans le maintien de la sant du systme nerveux et des globules rouges. Ce mdicament peut tre utilis pour d'autres indications ; adressez-vous  votre mdecin ou  votre pharmacien si vous [employer] questions. NOM(S) DE MARQUE COURANT(S) : B-12 Compliance Kit, B-12 Injection Kit, Cyomin, Dodex, LA-12, Nutri-Twelve, Physicians EZ Use B-12, Primabalt Que dois-je dire  mon fournisseur de [employer] de prendre ce mdicament ? Ils ont besoin de savoir si vous prsentez l'une quelconque des affections ou situations suivantes : Maladie rnale Maladie de [place] Anmie mgaloblastique Raction inhabituelle ou allergique  la cyanocobalamine ou au cobalt Raction inhabituelle ou allergique  d'autres mdicaments,  des aliments,  des colorants ou  des conservateurs Grossesse ou projet de grossesse Allaitement Comment dois-je utiliser ce mdicament ? Ce mdicament est inject dans un muscle ou en profondeur sous la peau. Il est habituellement administr par [employer] quipe de soins dans un tablissement [employer] ou un Theme park manager. Cependant, votre quipe de soins peut vous apprendre  vous l'injecter vous-mme. [place] toutes les instructions. Pour toute information relative  l'utilisation de ce mdicament [employer], adressez-vous  votre quipe de [place]. Des prcautions particulires Electrical engineer. Surdosage : si vous pensez avoir pris ce mdicament en excs, contactez immdiatement un centre [employer]. REMARQUE : ce mdicament vous est uniquement destin. Ne partagez pas ce Location manager. Que  faire si je saute une dose ? Si votre dose est administre dans [employer] ou un Theme park manager, Geneticist, molecular. Si vous vous injectez vous-mme le mdicament et que vous avez saut une dose, injectez-la ds que possible. S'il est presque l'heure de votre prochaine dose, ne prenez que cette dose-l. Ne prenez ni doubles doses, ni doses supplmentaires. Quelles sont les interactions possibles avec ce mdicament ? Alcool Colchicine Cette liste peut ne pas dcrire toutes les interactions mdicamenteuses possibles. Donnez  votre fournisseur de Kellogg de tous les mdicaments, plantes mdicinales, mdicaments en vente libre ou complments alimentaires que vous prenez. Dites-lui aussi si vous fumez, buvez de l'alcool ou consommez des drogues illicites. Certaines substances peuvent interagir Physiological scientist. Que dois-je Museum/gallery exhibitions officer par ce mdicament ? Consultez rgulirement votre quipe de soins pour un bilan. Vous devrez [employer]ak de sang rgulires Radiographer, therapeutic par [employer]. Vous devrez ventuellement suivre un rgime alimentaire spcial. Consultez votre quipe de soins. Limitez votre consommation d'alcool et vitez de fumer pour tirer NIKE bnfice de ce mdicament. Quels effets secondaires puis-je remarquer en prenant ce mdicament ? Effets secondaires que vous devez signaler  votre quipe de soins ds que possible : Ractions allergiques : ruption cutane, dmangeaisons, urticaire, gonflement du visage, des lvres, de la langue ou de la gorge Gonflement des [place], des mains ou des pieds Difficults  respirer Effets secondaires ne ncessitant gnralement pas un avis mdical (signalez-les  votre quipe de soins s'ils persistent ou sont gnants) : Diarrhe Cette liste peut ne pas dcrire tous les effets secondaires possibles. Appelez votre mdecin pour [employer] sujet des [employer]. Vous pouvez signaler les effets secondaires  la FDA, au [id number]. O dois-je  conserver mon mdicament ? Conserver hors de Insurance underwriter. Conserver  une temprature ambiante comprise entre 15 et 30 C (55 et 48 F). Protger de la lumire. Jeter tout mdicament inutilis aprs la date de premption figurant sur l'tiquette ou sur Risk manager. REMARQUE : cette notice est un rsum. Elle peut ne pas contenir toutes les informations possibles. Si vous avez des questions  propos de ce mdicament, Secretary/administrator mdecin, votre pharmacien ou votre fournisseur de [place].  2023 Elsevier/Gold Standard (2021-10-14 00:00:00)

## 2022-10-30 ENCOUNTER — Inpatient Hospital Stay: Payer: BC Managed Care – PPO

## 2022-10-30 DIAGNOSIS — D509 Iron deficiency anemia, unspecified: Secondary | ICD-10-CM | POA: Diagnosis not present

## 2022-10-30 DIAGNOSIS — D539 Nutritional anemia, unspecified: Secondary | ICD-10-CM

## 2022-10-30 MED ORDER — CYANOCOBALAMIN 1000 MCG/ML IJ SOLN
1000.0000 ug | Freq: Once | INTRAMUSCULAR | Status: AC
  Administered 2022-10-30: 1000 ug via INTRAMUSCULAR
  Filled 2022-10-30: qty 1

## 2022-10-30 NOTE — Patient Instructions (Signed)
Vitamin B12 Deficiency Vitamin B12 deficiency occurs when the body does not have enough of this important vitamin. The body needs this vitamin: To make red blood cells. To make DNA. This is the genetic material inside cells. To help the nerves work properly so they can carry messages from the brain to the body. Vitamin B12 deficiency can cause health problems, such as not having enough red blood cells in the blood (anemia). This can lead to nerve damage if untreated. What are the causes? This condition may be caused by: Not eating enough foods that contain vitamin B12. Not having enough stomach acid and digestive fluids to properly absorb vitamin B12 from the food that you eat. Having certain diseases that make it hard to absorb vitamin B12. These diseases include Crohn's disease, chronic pancreatitis, and cystic fibrosis. An autoimmune disorder in which the body does not make enough of a protein (intrinsic factor) within the stomach, resulting in not enough absorption of vitamin B12. Having a surgery in which part of the stomach or small intestine is removed. Taking certain medicines that make it hard for the body to absorb vitamin B12. These include: Heartburn medicines, such as antacids and proton pump inhibitors. Some medicines that are used to treat diabetes. What increases the risk? The following factors may make you more likely to develop a vitamin B12 deficiency: Being an older adult. Eating a vegetarian or vegan diet that does not include any foods that come from animals. Eating a poor diet while you are pregnant. Taking certain medicines. Having alcoholism. What are the signs or symptoms? In some cases, there are no symptoms of this condition. If the condition leads to anemia or nerve damage, various symptoms may occur, such as: Weakness. Tiredness (fatigue). Loss of appetite. Numbness or tingling in your hands and feet. Redness and burning of the tongue. Depression,  confusion, or memory problems. Trouble walking. If anemia is severe, symptoms can include: Shortness of breath. Dizziness. Rapid heart rate. How is this diagnosed? This condition may be diagnosed with a blood test to measure the level of vitamin B12 in your blood. You may also have other tests, including: A group of tests that measure certain characteristics of blood cells (complete blood count, CBC). A blood test to measure intrinsic factor. A procedure where a thin tube with a camera on the end is used to look into your stomach or intestines (endoscopy). Other tests may be needed to discover the cause of the deficiency. How is this treated? Treatment for this condition depends on the cause. This condition may be treated by: Changing your eating and drinking habits, such as: Eating more foods that contain vitamin B12. Drinking less alcohol or no alcohol. Getting vitamin B12 injections. Taking vitamin B12 supplements by mouth (orally). Your health care provider will tell you which dose is best for you. Follow these instructions at home: Eating and drinking  Include foods in your diet that come from animals and contain a lot of vitamin B12. These include: Meats and poultry. This includes beef, pork, chicken, turkey, and organ meats, such as liver. Seafood. This includes clams, rainbow trout, salmon, tuna, and haddock. Eggs. Dairy foods such as milk, yogurt, and cheese. Eat foods that have vitamin B12 added to them (are fortified), such as ready-to-eat breakfast cereals. Check the label on the package to see if a food is fortified. The items listed above may not be a complete list of foods and beverages you can eat and drink. Contact a dietitian for   more information. Alcohol use Do not drink alcohol if: Your health care provider tells you not to drink. You are pregnant, may be pregnant, or are planning to become pregnant. If you drink alcohol: Limit how much you have to: 0-1 drink a  day for women. 0-2 drinks a day for men. Know how much alcohol is in your drink. In the [place] one drink equals one 12 oz bottle of beer (355 mL), one 5 oz glass of wine (148 mL), or one 1 oz glass of hard liquor (44 mL). General instructions Get vitamin B12 injections if told to by your health care provider. Take supplements only as told by your health care provider. Follow the directions carefully. Keep all follow-up visits. This is important. Contact a health care provider if: Your symptoms come back. Your symptoms get worse or do not improve with treatment. Get help right away: You develop shortness of breath. You have a rapid heart rate. You have chest pain. You become dizzy or you faint. These symptoms may be an emergency. Get help right away. Call 911. Do not wait to see if the symptoms will go away. Do not drive yourself to the hospital. Summary Vitamin B12 deficiency occurs when the body does not have enough of this important vitamin. Common causes include not eating enough foods that contain vitamin B12, not being able to absorb vitamin B12 from the food that you eat, having a surgery in which part of the stomach or small intestine is removed, or taking certain medicines. Eat foods that have vitamin B12 in them. Treatment may include making a change in the way you eat and drink, getting vitamin B12 injections, or taking vitamin B12 supplements. This information is not intended to replace advice given to you by your health care provider. Make sure you discuss any questions you have with your health care provider. Document Revised: 05/17/2021 Document Reviewed: 05/17/2021 Elsevier Patient Education  2023 [employer]  

## 2022-10-31 ENCOUNTER — Inpatient Hospital Stay: Payer: BC Managed Care – PPO

## 2022-10-31 ENCOUNTER — Other Ambulatory Visit: Payer: Self-pay

## 2022-10-31 DIAGNOSIS — D509 Iron deficiency anemia, unspecified: Secondary | ICD-10-CM | POA: Diagnosis not present

## 2022-10-31 DIAGNOSIS — D539 Nutritional anemia, unspecified: Secondary | ICD-10-CM

## 2022-10-31 MED ORDER — CYANOCOBALAMIN 1000 MCG/ML IJ SOLN
1000.0000 ug | Freq: Once | INTRAMUSCULAR | Status: AC
  Administered 2022-10-31: 1000 ug via INTRAMUSCULAR
  Filled 2022-10-31: qty 1

## 2022-11-04 ENCOUNTER — Other Ambulatory Visit: Payer: Self-pay

## 2022-11-04 ENCOUNTER — Encounter: Payer: Self-pay | Admitting: Oncology

## 2022-11-05 ENCOUNTER — Telehealth: Payer: Self-pay

## 2022-11-05 NOTE — Telephone Encounter (Addendum)
Called the patient and let her know that we are waiting on the insurance company to complete the PA. They are waiting on the referral from the PCP. Butch Penny and Hillard Danker are working on this. Made the patient aware that if we dont have PA that her Saturday appointment may be cancelled.    ----- Message from Lynnae Sandhoff, LPN sent at 8/32/5498 10:50 AM EST ----- Regarding: RE: Iron Infusions Yes, mom is first contact.  Heather Krass,  do you know if a schedule message was sent? Can you call the pt?   Thanks, Mickel Baas ----- Message ----- From: Barbee Cough, MD Sent: 11/05/2022  10:34 AM EST To: Elayne Guerin, CMA; Lynnae Sandhoff, LPN Subject: RE: Iron Infusions                             I believe that is being worked on.  I will leave it to staff to make that phone call Also the patient is 21, is mother on the HIPAA  ----- Message ----- From: Lynnae Sandhoff, LPN Sent: 2/64/1583   4:37 PM EST To: Elayne Guerin, CMA; Barbee Cough, MD Subject: Iron Infusions                                 Hi,  I received a voicemail from this pt's mother this afternoon asking about why she has not been scheduled for iron infusions yet. Could you please call her? I took the voicemail before I could transfer it over to you.   Thanks,  Mickel Baas, LPN

## 2022-11-06 ENCOUNTER — Telehealth: Payer: Self-pay

## 2022-11-06 NOTE — Telephone Encounter (Signed)
Returned pt's mother call regarding pt's IV infusions.  Pt's mother sated the pt's insurance informed pt and mother that the billing code is incorrect for the IV Iron infusion; therefore, that's why they are saying a referral is needed from PCP.   This RN sent a Secure Chat message to Drucie Opitz (covering for Corene Cornea who is out of the office today) to please verify that the correct billing code/s are being used. Pt's mother stated they were told the billing code being used is for an exam and not for IV Iron infusions.  Informed pt's mom that Dr. Jeralyn Bennett staff with check with our Revenue Cycling Team to confirm that the billing code being used is correct and will follow-up with them once we hear back from them.  Pt's mother verbalized understanding and had no further questions at this time.  Awaiting response from Revenue Cycle Team.

## 2022-11-07 MED FILL — Ferumoxytol Inj 510 MG/17ML (30 MG/ML) (Elemental Fe): INTRAVENOUS | Qty: 17 | Status: AC

## 2022-11-08 ENCOUNTER — Inpatient Hospital Stay: Payer: BC Managed Care – PPO | Attending: Oncology

## 2022-11-08 VITALS — BP 123/87 | HR 82 | Temp 98.3°F | Resp 18

## 2022-11-08 DIAGNOSIS — Z79899 Other long term (current) drug therapy: Secondary | ICD-10-CM | POA: Diagnosis not present

## 2022-11-08 DIAGNOSIS — D509 Iron deficiency anemia, unspecified: Secondary | ICD-10-CM | POA: Diagnosis present

## 2022-11-08 DIAGNOSIS — F5089 Other specified eating disorder: Secondary | ICD-10-CM | POA: Diagnosis not present

## 2022-11-08 DIAGNOSIS — Z8379 Family history of other diseases of the digestive system: Secondary | ICD-10-CM | POA: Diagnosis not present

## 2022-11-08 DIAGNOSIS — D259 Leiomyoma of uterus, unspecified: Secondary | ICD-10-CM | POA: Insufficient documentation

## 2022-11-08 DIAGNOSIS — E538 Deficiency of other specified B group vitamins: Secondary | ICD-10-CM | POA: Diagnosis not present

## 2022-11-08 DIAGNOSIS — Z833 Family history of diabetes mellitus: Secondary | ICD-10-CM | POA: Diagnosis not present

## 2022-11-08 DIAGNOSIS — Z886 Allergy status to analgesic agent status: Secondary | ICD-10-CM | POA: Insufficient documentation

## 2022-11-08 DIAGNOSIS — K921 Melena: Secondary | ICD-10-CM | POA: Insufficient documentation

## 2022-11-08 DIAGNOSIS — I3139 Other pericardial effusion (noninflammatory): Secondary | ICD-10-CM | POA: Insufficient documentation

## 2022-11-08 DIAGNOSIS — N938 Other specified abnormal uterine and vaginal bleeding: Secondary | ICD-10-CM | POA: Diagnosis not present

## 2022-11-08 DIAGNOSIS — D6800 Von Willebrand disease, unspecified: Secondary | ICD-10-CM | POA: Insufficient documentation

## 2022-11-08 DIAGNOSIS — Q613 Polycystic kidney, unspecified: Secondary | ICD-10-CM | POA: Insufficient documentation

## 2022-11-08 DIAGNOSIS — R0609 Other forms of dyspnea: Secondary | ICD-10-CM | POA: Insufficient documentation

## 2022-11-08 DIAGNOSIS — K5900 Constipation, unspecified: Secondary | ICD-10-CM | POA: Diagnosis not present

## 2022-11-08 DIAGNOSIS — N920 Excessive and frequent menstruation with regular cycle: Secondary | ICD-10-CM | POA: Diagnosis not present

## 2022-11-08 DIAGNOSIS — Z8249 Family history of ischemic heart disease and other diseases of the circulatory system: Secondary | ICD-10-CM | POA: Diagnosis not present

## 2022-11-08 DIAGNOSIS — R5383 Other fatigue: Secondary | ICD-10-CM | POA: Diagnosis not present

## 2022-11-08 DIAGNOSIS — Z8349 Family history of other endocrine, nutritional and metabolic diseases: Secondary | ICD-10-CM | POA: Insufficient documentation

## 2022-11-08 DIAGNOSIS — Z8619 Personal history of other infectious and parasitic diseases: Secondary | ICD-10-CM | POA: Insufficient documentation

## 2022-11-08 DIAGNOSIS — D539 Nutritional anemia, unspecified: Secondary | ICD-10-CM

## 2022-11-08 DIAGNOSIS — Z841 Family history of disorders of kidney and ureter: Secondary | ICD-10-CM | POA: Insufficient documentation

## 2022-11-08 MED ORDER — CYANOCOBALAMIN 1000 MCG/ML IJ SOLN
1000.0000 ug | Freq: Once | INTRAMUSCULAR | Status: AC
  Administered 2022-11-08: 1000 ug via INTRAMUSCULAR

## 2022-11-08 MED ORDER — LORATADINE 10 MG PO TABS
10.0000 mg | ORAL_TABLET | Freq: Once | ORAL | Status: AC
  Administered 2022-11-08: 10 mg via ORAL
  Filled 2022-11-08: qty 1

## 2022-11-08 MED ORDER — SODIUM CHLORIDE 0.9 % IV SOLN
510.0000 mg | Freq: Once | INTRAVENOUS | Status: AC
  Administered 2022-11-08: 510 mg via INTRAVENOUS
  Filled 2022-11-08: qty 17

## 2022-11-08 MED ORDER — SODIUM CHLORIDE 0.9 % IV SOLN: Freq: Once | INTRAVENOUS | Status: AC

## 2022-11-08 MED ORDER — ACETAMINOPHEN 325 MG PO TABS
650.0000 mg | ORAL_TABLET | Freq: Once | ORAL | Status: AC
  Administered 2022-11-08: 650 mg via ORAL
  Filled 2022-11-08: qty 2

## 2022-11-08 NOTE — Patient Instructions (Signed)

## 2022-11-14 ENCOUNTER — Inpatient Hospital Stay: Payer: BC Managed Care – PPO

## 2022-11-14 ENCOUNTER — Encounter: Payer: Self-pay | Admitting: Oncology

## 2022-11-14 ENCOUNTER — Inpatient Hospital Stay (HOSPITAL_BASED_OUTPATIENT_CLINIC_OR_DEPARTMENT_OTHER): Payer: BC Managed Care – PPO | Admitting: Oncology

## 2022-11-14 VITALS — BP 118/79 | HR 74 | Temp 98.1°F | Resp 18 | Wt 221.0 lb

## 2022-11-14 DIAGNOSIS — N938 Other specified abnormal uterine and vaginal bleeding: Secondary | ICD-10-CM

## 2022-11-14 DIAGNOSIS — N920 Excessive and frequent menstruation with regular cycle: Secondary | ICD-10-CM

## 2022-11-14 DIAGNOSIS — D510 Vitamin B12 deficiency anemia due to intrinsic factor deficiency: Secondary | ICD-10-CM | POA: Insufficient documentation

## 2022-11-14 DIAGNOSIS — K921 Melena: Secondary | ICD-10-CM

## 2022-11-14 DIAGNOSIS — E538 Deficiency of other specified B group vitamins: Secondary | ICD-10-CM | POA: Diagnosis not present

## 2022-11-14 DIAGNOSIS — D500 Iron deficiency anemia secondary to blood loss (chronic): Secondary | ICD-10-CM | POA: Diagnosis not present

## 2022-11-14 DIAGNOSIS — D539 Nutritional anemia, unspecified: Secondary | ICD-10-CM

## 2022-11-14 DIAGNOSIS — D509 Iron deficiency anemia, unspecified: Secondary | ICD-10-CM | POA: Diagnosis not present

## 2022-11-14 DIAGNOSIS — A0472 Enterocolitis due to Clostridium difficile, not specified as recurrent: Secondary | ICD-10-CM

## 2022-11-14 LAB — CBC WITH DIFFERENTIAL/PLATELET
Abs Immature Granulocytes: 0.01 10*3/uL (ref 0.00–0.07)
Basophils Absolute: 0.1 10*3/uL (ref 0.0–0.1)
Basophils Relative: 1 %
Eosinophils Absolute: 0.1 10*3/uL (ref 0.0–0.5)
Eosinophils Relative: 2 %
HCT: 34.8 % — ABNORMAL LOW (ref 36.0–46.0)
Hemoglobin: 11 g/dL — ABNORMAL LOW (ref 12.0–15.0)
Immature Granulocytes: 0 %
Lymphocytes Relative: 25 %
Lymphs Abs: 1.5 10*3/uL (ref 0.7–4.0)
MCH: 23.3 pg — ABNORMAL LOW (ref 26.0–34.0)
MCHC: 31.6 g/dL (ref 30.0–36.0)
MCV: 73.7 fL — ABNORMAL LOW (ref 80.0–100.0)
Monocytes Absolute: 0.4 10*3/uL (ref 0.1–1.0)
Monocytes Relative: 7 %
Neutro Abs: 3.8 10*3/uL (ref 1.7–7.7)
Neutrophils Relative %: 65 %
Platelets: 367 10*3/uL (ref 150–400)
RBC: 4.72 MIL/uL (ref 3.87–5.11)
RDW: 19.9 % — ABNORMAL HIGH (ref 11.5–15.5)
WBC: 5.9 10*3/uL (ref 4.0–10.5)
nRBC: 0 % (ref 0.0–0.2)

## 2022-11-14 LAB — FERRITIN: Ferritin: 242 ng/mL (ref 11–307)

## 2022-11-14 LAB — T4, FREE: Free T4: 0.71 ng/dL (ref 0.61–1.12)

## 2022-11-14 LAB — VITAMIN B12: Vitamin B-12: 374 pg/mL (ref 180–914)

## 2022-11-14 LAB — CORTISOL: Cortisol, Plasma: 13 ug/dL

## 2022-11-14 LAB — TSH: TSH: 3.338 u[IU]/mL (ref 0.350–4.500)

## 2022-11-14 NOTE — Patient Instructions (Signed)
Please start Vitamin B12 1000 micrograms by mouth daily

## 2022-11-14 NOTE — Progress Notes (Signed)
[facility] Follow up Visit:  Patient Care Team: Buckley, [place] J, PA-C as PCP - General (Physician Assistant)  CHIEF COMPLAINTS/PURPOSE OF CONSULTATION:  HISTORY OF PRESENTING ILLNESS: Heather Goodman 22 y.o. female is here because of  anemia August 18, 2022: CT abdomen pelvis Possible thickening of left upper quadrant jejunal loops which may be secondary to enteritis of infectious or inflammatory etiology.  Polycystic kidneys. Small pericardial effusion.  August 18, 2022:  Folate 8 B12 109  September 09, 2022:   Stool for C. difficile positive.  Patient had not been on Abx prior to the diagnosis but had been undergoing CNA training  CT abdomen pelvis uterus and adnexa unremarkable.  No mass.  No evidence of Crohn's enteritis  October 10, 2022: WBC 5.9 hemoglobin 9.3 MCV 69 platelet count 374; 64 segs 27 lymphs 8 monos 2 eos 1 basophil Sed rate 28 CRP undetectable B12 169 ferritin 3  October 24 2022:  [facility]  Patient is G0 P0 Menopause not reached.  Last period was June 04 2022, she is on Nexplanon.  Prior to being placed on contraception she had heavy menses which occurred monthly and lasted 1 week.  Periods were regular.  Did not usually have bleeding between periods.    Patient does not have history of uterine fibroids/uterine abnormalities.   Was placed on oral iron at 43 yrs of age but was not consistent with it.  Has received PRBC's on 2 occasions.  Has never received IV iron.  Develops constipation with oral iron.     Has a normal diet.  No history of hemorrhage following loss deciduous teeth.  Has hematochezia but does not pass mucus.  No history of intra-articular or soft tissue bleeding No history of abnormal bleeding in family members except for fibroids and heavy menses in female family members.  Patient has symptoms of fatigue, pallor,  DOE, decreased performance status, intolerance to cold.  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  Tobacco none.  EtOH rare  [facility] Mother alive 32 DM Type II, fibroids, HTN Father alive 28 obesity Sisters alive 15 and 10 with polycystic kidney disease Brother alive 110 well  WBC 5.1 hemoglobin 10.1 MCV 72 platelet count 323; 71 segs 23 lymphs 5 monos 1 basophil Coombs test negative haptoglobin 96 Hemoglobin electrophoresis normal adult pattern Intrinsic factor blocking antibody 14.5 antiparietal cell antibody negative Folate 11.2  Fibrinogen 365 INR 1.0 PTT 31  October 24 2022:  Began Vitamin B12 replacement sq November 08 2022:  Feraheme 510 mg IV  November 14 2022:  Scheduled follow up for anemia.  Feels a bit better since receiving B12 and IV iron.  Pica has improved. Reviewed results of labs with patient.  Still having abdominal pain.    November 15 2022:  Feraheme 510 mg IV  November 28 2022:  Colnoscopy   Review of Systems - Oncology  MEDICAL HISTORY: Past Medical History:  Diagnosis Date   Ovarian cyst    Polycystic kidney disease, childhood type (CPKD)     SURGICAL HISTORY: No past surgical history on file.  SOCIAL HISTORY: Social History   Socioeconomic History   Marital status: Single    Spouse name: Not on file   Number of children: 0   Years of education: Not on file   Highest education level: Not on file  Occupational History   Occupation: Ship broker  Tobacco Use   Smoking status: Never   Smokeless tobacco: Never  Vaping Use   Vaping Use: Never used  Substance and Sexual Activity   Alcohol use: Never   Drug use: Never   Sexual activity: Yes    Birth control/protection: Implant  Other Topics Concern   Not on file  Social History Narrative   Not on file   Social Determinants of Health   Financial Resource Strain: Not on file  Food Insecurity: Not on file  Transportation Needs: Not on file  Physical Activity: Not on file  Stress: Not on file  Social  Connections: Not on file  Intimate Partner Violence: Not on file    FAMILY HISTORY Family History  Problem Relation Age of Onset   Kidney disease Mother    Kidney disease Sister    Crohn's disease Paternal Aunt    Stomach cancer Neg Hx    Colon cancer Neg Hx    Esophageal cancer Neg Hx     ALLERGIES:  is allergic to other, aspirin, bergera koenigii, and latex.  MEDICATIONS:  Current Outpatient Medications  Medication Sig Dispense Refill   acetaminophen (TYLENOL) 325 MG tablet Take 650 mg by mouth every 6 (six) hours as needed for moderate pain or headache.     cyanocobalamin (VITAMIN B12) 1000 MCG/ML injection Inject 49m every 14 days for 1 month and then 131mmonthly (Patient not taking: Reported on 10/24/2022) 14 mL 0   dicyclomine (BENTYL) 20 MG tablet Take 1 tablet (20 mg total) by mouth 2 (two) times daily. 20 tablet 0   ferrous sulfate 325 (65 FE) MG tablet Take 1 tablet (325 mg total) by mouth daily. 30 tablet 0   Multiple Vitamins-Minerals (ONE-A-DAY WOMENS) tablet Take 1 tablet by mouth daily.     nifedipine 0.3 % ointment Place 1 Application rectally 4 (four) times daily. 30 g 0   ondansetron (ZOFRAN) 4 MG tablet Take 1 tablet (4 mg total) by mouth every 8 (eight) hours as needed for nausea or vomiting. 20 tablet 0   phentermine (ADIPEX-P) 37.5 MG tablet Take 37.5 mg by mouth every morning.     polyethylene glycol powder (GLYCOLAX/MIRALAX) 17 GM/SCOOP powder Take 255 g by mouth as directed. 255 g 0   valACYclovir (VALTREX) 500 MG tablet Take 500 mg by mouth daily.     Vitamin D, Ergocalciferol, (DRISDOL) 1.25 MG (50000 UNIT) CAPS capsule Take 50,000 Units by mouth every Wednesday.     No current facility-administered medications for this visit.    PHYSICAL EXAMINATION:  ECOG PERFORMANCE STATUS: 1 - Symptomatic but completely ambulatory   There were no vitals filed for this visit.   There were no vitals filed for this visit.    Physical Exam Vitals and nursing  note reviewed.  Constitutional:      General: She is not in acute distress.    Appearance: Normal appearance. She is not ill-appearing, toxic-appearing or diaphoretic.     Comments: Here alone.  Aunt present by telephone.    HENT:     Head: Normocephalic and atraumatic.     Right Ear: External ear normal.     Left Ear: External ear normal.     Nose: Nose normal. No congestion or rhinorrhea.  Eyes:     General: No scleral icterus.    Extraocular Movements: Extraocular movements intact.     Conjunctiva/sclera: Conjunctivae normal.     Pupils: Pupils are equal, round, and reactive to light.  Cardiovascular:  Rate and Rhythm: Normal rate.     Heart sounds: Normal heart sounds. No murmur heard.    No friction rub. No gallop.  Pulmonary:     Effort: Pulmonary effort is normal. No respiratory distress.     Breath sounds: Normal breath sounds. No wheezing or rhonchi.  Abdominal:     General: Bowel sounds are normal.     Palpations: Abdomen is soft.     Tenderness: There is no abdominal tenderness. There is no guarding or rebound.  Musculoskeletal:        General: No swelling, tenderness or deformity.     Cervical back: Normal range of motion and neck supple. No rigidity or tenderness.  Lymphadenopathy:     Head:     Right side of head: No submental, submandibular, tonsillar, preauricular, posterior auricular or occipital adenopathy.     Left side of head: No submental, submandibular, tonsillar, preauricular, posterior auricular or occipital adenopathy.     Cervical: No cervical adenopathy.     Right cervical: No superficial, deep or posterior cervical adenopathy.    Left cervical: No superficial, deep or posterior cervical adenopathy.     Upper Body:     Right upper body: No supraclavicular, axillary, pectoral or epitrochlear adenopathy.     Left upper body: No supraclavicular, axillary, pectoral or epitrochlear adenopathy.  Skin:    General: Skin is warm.     Coloration: Skin is  not jaundiced or pale.  Neurological:     General: No focal deficit present.     Mental Status: She is alert and oriented to person, place, and time.     Cranial Nerves: No cranial nerve deficit.  Psychiatric:        Mood and Affect: Mood normal.        Behavior: Behavior normal.        Thought Content: Thought content normal.        Judgment: Judgment normal.     LABORATORY DATA: I have personally reviewed the data as listed:  Appointment on 10/24/2022  Component Date Value Ref Range Status   Intrinsic Factor 10/24/2022 14.5 (H)  0.0 - 1.1 AU/mL Final   Comment: (NOTE) Performed At: [facility] [place], [place] [id number] Rush Farmer MD [id number]    Hgb F 10/24/2022 0.0  0.0 - 2.0 % Final   Hgb A 10/24/2022 98.7  96.4 - 98.8 % Final   Hgb A2 10/24/2022 1.3 (L)  1.8 - 3.2 % Final   Hgb S 10/24/2022 0.0  0.0 % Final   Interpretation, Hgb Fract 10/24/2022 Comment   Final   Comment: (NOTE) Normal hemoglobin present; no hemoglobin variant or beta thalassemia identified. Note: Alpha thalassemia may not be detected by the Hgb Fractionation Cascade panel. If alpha thalassemia is suspected, [facility] offers Alpha-Thalassemia DNA Analysis [phone]). Low Hgb A2 may indicate an alpha-thalassemia or iron deficiency. Suggest clinical and hematologic correlation. Performed At: [facility] [address] [place], [place] [id number] Rush Farmer MD [id number]    DAT, complement 10/24/2022 NEG   Final   DAT, IgG 10/24/2022    Final                   Value:NEG Performed at [facility], [place] [address] [place], [place] [address]    Haptoglobin 10/24/2022 96  33 - 278 mg/dL Final   Comment: (NOTE) Performed At: [facility] [place], [place] [id number] Rush Farmer MD [id number]  aPTT 10/24/2022 31  24 - 36 seconds Final   Performed at [facility], [place] [address]  [place], [place] [address]   Prothrombin Time 10/24/2022 13.2  11.4 - 15.2 seconds Final   INR 10/24/2022 1.0  0.8 - 1.2 Final   Comment: (NOTE) INR goal varies based on device and disease states. Performed at [facility], [place] [address] [place], [place] [address]    Fibrinogen 10/24/2022 365  210 - 475 mg/dL Final   Comment: (NOTE) Fibrinogen results may be underestimated in patients receiving thrombolytic therapy. Performed at [facility], [place] [address], [place], [place] [address]    Folate 10/24/2022 11.2  >5.9 ng/mL Final   Performed at [facility], [place] [address], [place],  [address]  Infusion on 10/24/2022  Component Date Value Ref Range Status   Parietal Cell Antibody-IgG 10/24/2022 8.0  0.0 - 20.0 Units Final   Comment: (NOTE)                                Negative    0.0 - 20.0                                Equivocal  20.1 - 24.9                                Positive         >24.9 Parietal Cell Antibodies are found in 90% of patients with pernicious anemia and 30% of first degree relatives with pernicious anemia. Performed At: [facility] [place], [place] [id number] Rush Farmer MD [id number]   Office Visit on 10/24/2022  Component Date Value Ref Range Status   WBC 10/24/2022 5.1  4.0 - 10.5 K/uL Final   RBC 10/24/2022 4.50  3.87 - 5.11 MIL/uL Final   Hemoglobin 10/24/2022 10.1 (L)  12.0 - 15.0 g/dL Final   Comment: Reticulocyte Hemoglobin testing may be clinically indicated, consider ordering this additional test [id number]    HCT 10/24/2022 32.5 (L)  36.0 - 46.0 % Final   MCV 10/24/2022 72.2 (L)  80.0 - 100.0 fL Final   MCH 10/24/2022 22.4 (L)  26.0 - 34.0 pg Final   MCHC 10/24/2022 31.1  30.0 - 36.0 g/dL Final   RDW 10/24/2022 20.0 (H)  11.5 - 15.5 % Final   Platelets 10/24/2022 323  150 - 400 K/uL Final   nRBC 10/24/2022 0.0  0.0 - 0.2 % Final   Neutrophils  Relative % 10/24/2022 71  % Final   Neutro Abs 10/24/2022 3.6  1.7 - 7.7 K/uL Final   Lymphocytes Relative 10/24/2022 23  % Final   Lymphs Abs 10/24/2022 1.2  0.7 - 4.0 K/uL Final   Monocytes Relative 10/24/2022 5  % Final   Monocytes Absolute 10/24/2022 0.3  0.1 - 1.0 K/uL Final   Eosinophils Relative 10/24/2022 0  % Final   Eosinophils Absolute 10/24/2022 0.0  0.0 - 0.5 K/uL Final   Basophils Relative 10/24/2022 1  % Final   Basophils Absolute 10/24/2022 0.0  0.0 - 0.1 K/uL Final   Immature Granulocytes 10/24/2022 0  % Final   Abs Immature Granulocytes 10/24/2022 0.02  0.00 - 0.07 K/uL Final   Performed at [facility], [address] [place],  [address]    RADIOGRAPHIC STUDIES: I have personally reviewed the radiological images as listed and agree with the findings in the report  No results found.  ASSESSMENT/PLAN  Patient is a 22 year old female with symptomatic microcytic anemia who has a history of dysfunctional uterine bleeding.  Patient also has vitamin B12 deficiency and a recent history of C diff colitis  Anemia:  Due to iron deficiency owing to prior dysfunctional uterine bleeding exacerbated by B12 deficiency.    October 24 2022:  Began Vitamin B12 replacement sq November 08 2022:  Feraheme 510 mg IV November 15 2022: Feraheme 510 mg IV   Dysfunctional uterine bleeding:  This is characterized by menses that are prolonged, irregular as well as by heavy bleeding with passage of clots.   October 24 2022:  PLT count, PT, PTT, Fibrinogen, von Willebrand screen normal.  on Nexplanon per gynecology with control of menses  Vitamin B12 deficiency:   October 24 2022:  Intrinsic factor blocking Ab positive.  Antiparietal cell Ab negative Began parenteral B12 replacement.    C diff colitis:  Likely acquired via occupational exposure.  Management per GI  Hematochezia  November 28 2022: Colnoscopy     Cancer Staging  No matching staging  information was found for the patient.   No problem-specific Assessment & Plan notes found for this encounter.   No orders of the defined types were placed in this encounter.  31  minutes was spent in patient care.  This included time spent preparing to see the patient (e.g., review of tests), obtaining and/or reviewing separately obtained history, counseling and educating the patient/family/caregiver, ordering medications, tests, or procedures; documenting clinical information in the electronic or other health record, independently interpreting results and communicating results to the patient/family/caregiver as well as coordination of care.       All questions were answered. The patient knows to call the clinic with any problems, questions or concerns.  This note was electronically signed.    Barbee Cough, MD  11/14/2022 8:37 AM

## 2022-11-15 ENCOUNTER — Encounter (HOSPITAL_COMMUNITY): Payer: Self-pay

## 2022-11-15 ENCOUNTER — Emergency Department (HOSPITAL_COMMUNITY)
Admission: EM | Admit: 2022-11-15 | Discharge: 2022-11-15 | Disposition: A | Discharge status: Home / Self Care | Attending: Emergency Medicine | Admitting: Emergency Medicine

## 2022-11-15 ENCOUNTER — Emergency Department (HOSPITAL_COMMUNITY)

## 2022-11-15 ENCOUNTER — Inpatient Hospital Stay: Payer: BC Managed Care – PPO

## 2022-11-15 VITALS — BP 128/56 | HR 74 | Temp 97.2°F | Resp 16

## 2022-11-15 DIAGNOSIS — D649 Anemia, unspecified: Secondary | ICD-10-CM | POA: Insufficient documentation

## 2022-11-15 DIAGNOSIS — Z91040 Latex allergy status: Secondary | ICD-10-CM | POA: Diagnosis not present

## 2022-11-15 DIAGNOSIS — Z1152 Encounter for screening for COVID-19: Secondary | ICD-10-CM | POA: Diagnosis not present

## 2022-11-15 DIAGNOSIS — T454X5A Adverse effect of iron and its compounds, initial encounter: Secondary | ICD-10-CM | POA: Insufficient documentation

## 2022-11-15 DIAGNOSIS — D539 Nutritional anemia, unspecified: Secondary | ICD-10-CM

## 2022-11-15 DIAGNOSIS — T50905A Adverse effect of unspecified drugs, medicaments and biological substances, initial encounter: Secondary | ICD-10-CM

## 2022-11-15 LAB — RESP PANEL BY RT-PCR (RSV, FLU A&B, COVID)  RVPGX2
Influenza A by PCR: NEGATIVE
Influenza B by PCR: NEGATIVE
Resp Syncytial Virus by PCR: NEGATIVE
SARS Coronavirus 2 by RT PCR: NEGATIVE

## 2022-11-15 LAB — CBC
HCT: 35.9 % — ABNORMAL LOW (ref 36.0–46.0)
Hemoglobin: 11.1 g/dL — ABNORMAL LOW (ref 12.0–15.0)
MCH: 23.5 pg — ABNORMAL LOW (ref 26.0–34.0)
MCHC: 30.9 g/dL (ref 30.0–36.0)
MCV: 76.1 fL — ABNORMAL LOW (ref 80.0–100.0)
Platelets: 367 10*3/uL (ref 150–400)
RBC: 4.72 MIL/uL (ref 3.87–5.11)
RDW: 20.7 % — ABNORMAL HIGH (ref 11.5–15.5)
WBC: 6.7 10*3/uL (ref 4.0–10.5)
nRBC: 0 % (ref 0.0–0.2)

## 2022-11-15 LAB — BASIC METABOLIC PANEL
Anion gap: 9 (ref 5–15)
BUN: 8 mg/dL (ref 6–20)
CO2: 22 mmol/L (ref 22–32)
Calcium: 9.3 mg/dL (ref 8.9–10.3)
Chloride: 104 mmol/L (ref 98–111)
Creatinine, Ser: 0.82 mg/dL (ref 0.44–1.00)
GFR, Estimated: >60 mL/min (ref >60)
Glucose, Bld: 89 mg/dL (ref 70–99)
Potassium: 4 mmol/L (ref 3.5–5.1)
Sodium: 135 mmol/L (ref 135–145)

## 2022-11-15 LAB — I-STAT BETA HCG BLOOD, ED (MC, WL, AP ONLY): I-stat hCG, quantitative: <5 m[IU]/mL (ref <5)

## 2022-11-15 MED ORDER — SODIUM CHLORIDE 0.9 % IV SOLN: Freq: Once | INTRAVENOUS | Status: AC

## 2022-11-15 MED ORDER — CYANOCOBALAMIN 1000 MCG/ML IJ SOLN
1000.0000 ug | Freq: Once | INTRAMUSCULAR | Status: AC
  Administered 2022-11-15: 1000 ug via INTRAMUSCULAR
  Filled 2022-11-15: qty 1

## 2022-11-15 MED ORDER — LORATADINE 10 MG PO TABS
10.0000 mg | ORAL_TABLET | Freq: Once | ORAL | Status: AC
  Administered 2022-11-15: 10 mg via ORAL
  Filled 2022-11-15: qty 1

## 2022-11-15 MED ORDER — SODIUM CHLORIDE 0.9 % IV SOLN
510.0000 mg | Freq: Once | INTRAVENOUS | Status: AC
  Administered 2022-11-15: 510 mg via INTRAVENOUS
  Filled 2022-11-15: qty 510

## 2022-11-15 MED ORDER — ACETAMINOPHEN 325 MG PO TABS
650.0000 mg | ORAL_TABLET | Freq: Once | ORAL | Status: AC
  Administered 2022-11-15: 650 mg via ORAL
  Filled 2022-11-15: qty 2

## 2022-11-15 NOTE — ED Triage Notes (Signed)
Pt presents with c/o chills, nausea, fever secondary to getting an iron and b12 infusion today. Pt reports she called her MD and they sent her here to a possible reaction to the infusion.

## 2022-11-15 NOTE — Discharge Instructions (Signed)
Take over-the-counter medications such as Tylenol or ibuprofen for aches and pains.  Return to the ER for fevers worsening symptoms.

## 2022-11-15 NOTE — ED Provider Notes (Signed)
[facility] Provider Note   CSN: [id number] Arrival date & time: 11/15/22  1614     History  Chief Complaint  Patient presents with   Chills   Nausea    Heather Goodman is a 22 y.o. female.  HPI   Patient has a history of polycystic kidney disease, ovarian cysts, anemia who presents to the ED for evaluation of chills nausea feeling feverish and myalgias.  Patient received a ferumoxytol infusion today.  She was also administered cyanocobalamin.  Patient states after the procedure she started having diffuse bodyaches.  Patient states she started feeling nauseated and chilled as well as feverish.  She called the doctor and instructed to come to the ED for evaluation of a possible reaction.  Patient states she has been coughing.  She denies any sore throat.  No abdominal pain.  No vomiting or diarrhea.  Home Medications Prior to Admission medications   Medication Sig Start Date End Date Taking? Authorizing Provider  acetaminophen (TYLENOL) 325 MG tablet Take 650 mg by mouth every 6 (six) hours as needed for moderate pain or headache. 01/05/15   [provider]  cyanocobalamin (VITAMIN B12) 1000 MCG/ML injection Inject 67m every 14 days for 1 month and then 19mmonthly 09/04/22   GuJackquline DenmarkMD  dicyclomine (BENTYL) 20 MG tablet Take 1 tablet (20 mg total) by mouth 2 (two) times daily. 08/18/22   HaIsla PenceMD  ferrous sulfate 325 (65 FE) MG tablet Take 1 tablet (325 mg total) by mouth daily. 08/18/22 11/14/22  HaIsla PenceMD  Multiple Vitamins-Minerals (ONE-A-DAY WOMENS) tablet Take 1 tablet by mouth daily.    [provider]  nifedipine 0.3 % ointment Place 1 Application rectally 4 (four) times daily. 08/18/22   HaIsla PenceMD  ondansetron (ZOFRAN) 4 MG tablet Take 1 tablet (4 mg total) by mouth every 8 (eight) hours as needed for nausea or vomiting. 08/18/22   HaIsla PenceMD  phentermine (ADIPEX-P)  37.5 MG tablet Take 37.5 mg by mouth every morning. 10/08/22   [provider]  polyethylene glycol powder (GLYCOLAX/MIRALAX) 17 GM/SCOOP powder Take 255 g by mouth as directed. 09/30/22   GuJackquline DenmarkMD  valACYclovir (VALTREX) 500 MG tablet Take 500 mg by mouth daily. 08/05/19   [provider]  Vitamin D, Ergocalciferol, (DRISDOL) 1.25 MG (50000 UNIT) CAPS capsule Take 50,000 Units by mouth every Wednesday. 09/02/20   [provider]      Allergies    Other, Aspirin, Bergera koenigii, and Latex    Review of Systems   Review of Systems  Physical Exam Updated Vital Signs BP 123/79 (BP Location: Left Arm)   Pulse 66   Temp 98.4 F (36.9 C) (Oral)   Resp 17   LMP 11/15/2022 (Approximate)   SpO2 100%  Physical Exam Vitals and nursing note reviewed.  Constitutional:      General: She is not in acute distress.    Appearance: She is well-developed.  HENT:     Head: Normocephalic and atraumatic.     Right Ear: External ear normal.     Left Ear: External ear normal.     Nose: No congestion or rhinorrhea.     Mouth/Throat:     Pharynx: No oropharyngeal exudate or posterior oropharyngeal erythema.  Eyes:     General: No scleral icterus.       Right eye: No discharge.        Left eye: No  discharge.     Conjunctiva/sclera: Conjunctivae normal.  Neck:     Trachea: No tracheal deviation.  Cardiovascular:     Rate and Rhythm: Normal rate and regular rhythm.  Pulmonary:     Effort: Pulmonary effort is normal. No respiratory distress.     Breath sounds: Normal breath sounds. No stridor. No wheezing or rales.  Abdominal:     General: Bowel sounds are normal. There is no distension.     Palpations: Abdomen is soft.     Tenderness: There is no abdominal tenderness. There is no guarding or rebound.  Musculoskeletal:        General: No tenderness or deformity.     Cervical back: Neck supple.  Skin:    General: Skin is warm and dry.     Findings: No rash.   Neurological:     General: No focal deficit present.     Mental Status: She is alert.     Cranial Nerves: No cranial nerve deficit, dysarthria or facial asymmetry.     Sensory: No sensory deficit.     Motor: No abnormal muscle tone or seizure activity.     Coordination: Coordination normal.  Psychiatric:        Mood and Affect: Mood normal.     ED Results / Procedures / Treatments   Labs (all labs ordered are listed, but only abnormal results are displayed) Labs Reviewed  CBC - Abnormal; Notable for the following components:      Result Value   Hemoglobin 11.1 (*)    HCT 35.9 (*)    MCV 76.1 (*)    MCH 23.5 (*)    RDW 20.7 (*)    All other components within normal limits  RESP PANEL BY RT-PCR (RSV, FLU A&B, COVID)  [id number]  BASIC METABOLIC PANEL  I-STAT BETA HCG BLOOD, ED ([facility], [facility], [facility] ONLY)    EKG None  Radiology DG Chest 2 View  Result Date: 11/15/2022 CLINICAL DATA:  Cough. EXAM: CHEST - 2 VIEW COMPARISON:  01/21/2021. FINDINGS: Normal heart, mediastinum and hila. Clear lungs.  No pleural effusion or pneumothorax. Skeletal structures are within normal limits. IMPRESSION: Normal chest radiographs. Electronically Signed   By: Lajean Manes M.D.   On: 11/15/2022 18:00    Procedures Procedures    Medications Ordered in ED Medications  acetaminophen (TYLENOL) tablet 650 mg (650 mg Oral Given 11/15/22 1723)    ED Course/ Medical Decision Making/ A&P Clinical Course as of 11/15/22 1830  Sat Nov 15, 2022  1807 CBC(!) CBC shows hemoglobin 11.  Metabolic panel normal.  Flu RSV negative. [id number] x-ray without acute abnormalities [JK]    Clinical Course User Index [JK] Dorie Rank, MD                             Medical Decision Making Problems Addressed: Medication adverse effect, initial encounter: acute illness or injury that poses a threat to life or bodily functions  Amount and/or Complexity of Data Reviewed Labs: ordered. Decision-making details documented  in ED Course. Radiology: ordered and independent interpretation performed.  Risk OTC drugs.   Patient presented to the ED with complaints of shortness of breath myalgias with concerns for acute reaction to her iron and B12 infusion today.  Fortunately no signs of any anaphylactic reaction.  Patient does not have hives.  She is not having any wheezing.  Patient was monitored in the ED for couple of hours  and fortunately did not show any signs of worsening symptoms.  No evidence of pneumonia.  Laboratory tests are otherwise unremarkable and reassuring.  Suspect adverse reaction to her medications but fortunately no signs of anaphylaxis.  Evaluation and diagnostic testing in the emergency department does not suggest an emergent condition requiring admission or immediate intervention beyond what has been performed at this time.  The patient is safe for discharge and has been instructed to return immediately for worsening symptoms, change in symptoms or any other concerns.        Final Clinical Impression(s) / ED Diagnoses Final diagnoses:  Medication adverse effect, initial encounter    Rx / DC Orders ED Discharge Orders     None         Dorie Rank, MD 11/15/22 [phone]

## 2022-11-15 NOTE — Patient Instructions (Signed)

## 2022-11-18 LAB — CELIAC DISEASE PANEL
Endomysial Ab, IgA: NEGATIVE
IgA: 168 mg/dL (ref 87–352)
Tissue Transglutaminase Ab, IgA: <2 U/mL (ref 0–3)

## 2022-11-21 ENCOUNTER — Telehealth: Payer: Self-pay | Admitting: Oncology

## 2022-11-21 LAB — MISC LABCORP TEST (SEND OUT): Labcorp test code: 167120

## 2022-11-21 NOTE — Telephone Encounter (Signed)
Called patient regarding upcoming March appointments, patient is notified.

## 2022-11-24 ENCOUNTER — Encounter: Payer: Self-pay | Admitting: Oncology

## 2022-11-24 DIAGNOSIS — K921 Melena: Secondary | ICD-10-CM | POA: Insufficient documentation

## 2022-11-28 ENCOUNTER — Ambulatory Visit: Admitting: Gastroenterology

## 2022-11-28 VITALS — BP 123/74 | HR 75 | Temp 98.4°F | Ht 68.0 in | Wt 219.0 lb

## 2022-11-28 DIAGNOSIS — S3289XA Fracture of other parts of pelvis, initial encounter for closed fracture: Secondary | ICD-10-CM | POA: Insufficient documentation

## 2022-11-28 DIAGNOSIS — Q613 Polycystic kidney, unspecified: Secondary | ICD-10-CM | POA: Insufficient documentation

## 2022-11-28 MED ORDER — SODIUM CHLORIDE 0.9 % IV SOLN: 500.0000 mL | Freq: Once | INTRAVENOUS | Status: AC

## 2022-11-28 NOTE — Progress Notes (Unsigned)
Pt took Phentermine this past Sunday, 5 days ago.  Per our policy, needs to be off for 10 days.  Josh Monday CRNA and Dr. Lyndel Safe aware and state procedure needs to be canceled.  Rescheduled ECL for 01-20-23 at 11:30.  New instructions given and free Plenvu prep given

## 2022-11-29 NOTE — Progress Notes (Signed)
Unfortunately, endoscopic procedures were canceled by anesthesia since phentermine was only held for 5 days (as opposed to 10 days per anesthesia's requirements). When I saw her last, she was not on phentermine.  This was added later on. Discussed in detail with patient's mother over phone. Will reschedule her as soon as possible. Continue to hold phentermine until procedures are done Appreciate Elnita Maxwell and Trish coordinating endoscopic procedures [facility]

## 2022-12-01 ENCOUNTER — Encounter: Payer: Self-pay | Admitting: Oncology

## 2022-12-03 ENCOUNTER — Telehealth: Payer: Self-pay

## 2022-12-03 NOTE — Telephone Encounter (Signed)
Received Secure Chat message from Hinda Lenis, RN stating that the pt LVM stating as follows:  hello john- I got a VM from pt stating she sent a message via the portal as well as she has onset of severe tingling of hands and feet- I erased it ...she gave her return number as [phone]   Called pt regarding her voicemail message.  Informed pt that the severe tingling of her hands and feet are expected for Pernicious anemia.  Instructed pt to apply warm towels or hot packs to the extremities to help promote blood flow.  Pt verbalize understanding.  Pt also requested if she could get a letter for school stating that she's getting IV Iron infusions so she can turn into her professors.  Pt stated her grade is being deducted by missing class and assignments when at her infusion appts.  Informed pt that this RN will speak with Dr. Federico Flake regarding the letter and will upload the letter to MyChart if he approves.  Pt verbalized understanding and had no further questions or concerns.

## 2022-12-04 ENCOUNTER — Other Ambulatory Visit: Payer: Self-pay

## 2022-12-08 ENCOUNTER — Ambulatory Visit (AMBULATORY_SURGERY_CENTER): Payer: BC Managed Care – PPO | Admitting: Gastroenterology

## 2022-12-08 ENCOUNTER — Encounter: Payer: Self-pay | Admitting: Gastroenterology

## 2022-12-08 VITALS — BP 128/67 | HR 73 | Temp 98.6°F | Resp 12 | Ht 68.0 in | Wt 219.0 lb

## 2022-12-08 DIAGNOSIS — D509 Iron deficiency anemia, unspecified: Secondary | ICD-10-CM | POA: Diagnosis not present

## 2022-12-08 DIAGNOSIS — K582 Mixed irritable bowel syndrome: Secondary | ICD-10-CM | POA: Diagnosis present

## 2022-12-08 DIAGNOSIS — K2950 Unspecified chronic gastritis without bleeding: Secondary | ICD-10-CM

## 2022-12-08 MED ORDER — SODIUM CHLORIDE 0.9 % IV SOLN: 500.0000 mL | Freq: Once | INTRAVENOUS | Status: AC

## 2022-12-08 NOTE — Progress Notes (Signed)
A and O x3. Report to RN. Tolerated MAC anesthesia well.Teeth unchanged after procedure. 

## 2022-12-08 NOTE — Progress Notes (Signed)
Patient in recovery post upper endoscopy and colonoscopy. Noted in recovery that patient's bottom right lip is swollen and there is a thin area of blood (about 1cm) noted inside bottom right lip. Advised patient to apply ice to the area for comfort and apply vaseline or lip moisturizer to area to help it to heal. Gaye Pollack, CRNA, notified and has seen patient.

## 2022-12-08 NOTE — Progress Notes (Signed)
Chief Complaint: FU  Referring Provider:  Buckley, [place] J, PA-C      ASSESSMENT AND PLAN;   #1. IBS-alt diarrhea/constipation.  Intermittent rectal bleeding.  Neg recent CT AP 09/2022.  #2. Recent C Diff colitis 09/09/2022.  Treated with vancomycin  #4. Postprandial nausea.  Vomiting has resolved.  #3. IDA/vit B12 deficiency. ?Etiology.  Plan: -Recheck stool studies for C. difficile to ensure eradication. Need neg test for [facility] procedures per guidelines. -CBC, CMP, sed rate, CRP, Vit B12, celiac, iron studies  -Keep appt for EGD/colon -Continue iron/B12 supplements for now -Hematology appt thereafter, if still with anemia/problems. -D/w pt and mom in detail.   For EGD/colon today.  HPI:    Heather Goodman is a 22 y.o. female  With PCKD, IDA, B12 def Accompanied by her mom.  For FU   Stool studies were positive for C. Difficile 12/5.  EGD/colon rescheduled.  Treated with p.o. vancomycin 125 QID x 10 days.  With good but not 100% relief.  She overall feels better  Still with heartburn, postprandial nausea without vomiting.  Still has postprandial abdo bloating  Alternating diarrhea and constipation.  Bright red blood but away from the stool.  She brings in pictures.  Abdominal pain is much better.  No further weight loss.  Appetite is better.  Wt Readings from Last 3 Encounters:  12/08/22 219 lb (99.3 kg)  11/28/22 219 lb (99.3 kg)  11/14/22 221 lb (100.2 kg)    No menstrual cycles since sept 2023 d/t Mirena.  Underwent CT Abdo/pelvis on 09/09/2022 which was unremarkable.  For additional details, see previous note.   Past GI WU:  CT Abdo/pelvis with contrast 09/09/2022 -Polycystic kidney disease -No evidence of Crohn's enteritis.  No acute findings.  CT AP with contrast 08/18/2022 1. Possible thickening of left upper quadrant jejunal loops which may be secondary to enteritis of infectious or inflammatory etiology. 2. Polycystic kidneys.  Small pericardial effusion     CT Abdo/pelvis with contrast 01/2021 1. Small volume pelvic fluid which could be physiologic. 2. Polycystic kidneys  Past Medical History:  Diagnosis Date   Ovarian cyst    Polycystic kidney disease, childhood type (CPKD)     History reviewed. No pertinent surgical history.  Family History  Problem Relation Age of Onset   Kidney disease Mother    Kidney disease Sister    Crohn's disease Paternal Aunt    Stomach cancer Neg Hx    Colon cancer Neg Hx    Esophageal cancer Neg Hx     Social History   Tobacco Use   Smoking status: Never   Smokeless tobacco: Never  Vaping Use   Vaping Use: Never used  Substance Use Topics   Alcohol use: Never   Drug use: Never    Current Outpatient Medications  Medication Sig Dispense Refill   acetaminophen (TYLENOL) 325 MG tablet Take 650 mg by mouth every 6 (six) hours as needed for moderate pain or headache.     amphetamine-dextroamphetamine (ADDERALL XR) 10 MG 24 hr capsule 1 capsule in the morning Orally Once a day (Patient not taking: Reported on 11/28/2022)     cyanocobalamin (VITAMIN B12) 1000 MCG/ML injection Inject 34m every 14 days for 1 month and then 110mmonthly (Patient not taking: Reported on 12/08/2022) 14 mL 0   dicyclomine (BENTYL) 20 MG tablet Take 1 tablet (20 mg total) by mouth 2 (two) times daily. 20 tablet 0   ferrous sulfate 325 (65 FE) MG tablet  Take 1 tablet (325 mg total) by mouth daily. 30 tablet 0   hydrocortisone (ANUSOL-HC) 25 MG suppository      Multiple Vitamins-Minerals (ONE-A-DAY WOMENS) tablet Take 1 tablet by mouth daily.     nifedipine 0.3 % ointment Place 1 Application rectally 4 (four) times daily. 30 g 0   ondansetron (ZOFRAN) 4 MG tablet Take 1 tablet (4 mg total) by mouth every 8 (eight) hours as needed for nausea or vomiting. 20 tablet 0   phentermine (ADIPEX-P) 37.5 MG tablet Take 37.5 mg by mouth every morning.     polyethylene glycol powder (GLYCOLAX/MIRALAX) 17  GM/SCOOP powder Take 255 g by mouth as directed. 255 g 0   valACYclovir (VALTREX) 500 MG tablet Take 500 mg by mouth daily.     vancomycin (VANCOCIN) 125 MG capsule Take by mouth. (Patient not taking: Reported on 12/08/2022)     Vitamin D, Ergocalciferol, (DRISDOL) 1.25 MG (50000 UNIT) CAPS capsule Take 50,000 Units by mouth every Wednesday.     Current Facility-Administered Medications  Medication Dose Route Frequency Provider Last Rate Last Admin   0.9 %  sodium chloride infusion  500 mL Intravenous Once Jackquline Denmark, MD       0.9 %  sodium chloride infusion  500 mL Intravenous Once Jackquline Denmark, MD        Allergies  Allergen Reactions   Other Rash   Aspirin Other (See Comments)    Other reaction(s): Unknown   Bergera Koenigii Swelling    Curry seasoning   Latex Rash    Review of Systems:  neg     Physical Exam:    BP 118/77   Pulse 62   Temp 98.6 F (37 C)   Resp 11   Ht '5\' 8"'$  (1.727 m)   Wt 219 lb (99.3 kg)   LMP 11/15/2022 (Approximate)   SpO2 100%   BMI 33.30 kg/m  Wt Readings from Last 3 Encounters:  12/08/22 219 lb (99.3 kg)  11/28/22 219 lb (99.3 kg)  11/14/22 221 lb (100.2 kg)   Constitutional:  Well-developed, in no acute distress. Psychiatric: Normal mood and affect. Behavior is normal. HEENT: Pupils normal.  Conjunctivae are normal. No scleral icterus. Cardiovascular: Normal rate, regular rhythm. No edema Pulmonary/chest: Effort normal and breath sounds normal. No wheezing, rales or rhonchi. Abdominal: Soft, nondistended. Nontender. Bowel sounds active throughout. There are no masses palpable. No hepatomegaly. Rectal: Deferred Neurological: Alert and oriented to person place and time. Skin: Skin is warm and dry. No rashes noted.  Data Reviewed: I have personally reviewed following labs and imaging studies  CBC:    Latest Ref Rng & Units 11/15/2022    4:57 PM 11/14/2022   10:09 AM 10/24/2022   11:18 AM  CBC  WBC 4.0 - 10.5 K/uL 6.7  5.9  5.1    Hemoglobin 12.0 - 15.0 g/dL 11.1  11.0  10.1   Hematocrit 36.0 - 46.0 % 35.9  34.8  32.5   Platelets 150 - 400 K/uL 367  367  323     CMP:    Latest Ref Rng & Units 11/15/2022    4:57 PM 10/10/2022    9:54 AM 09/08/2022   11:06 PM  CMP  Glucose 70 - 99 mg/dL 89  96  105   BUN 6 - 20 mg/dL '8  7  8   '$ Creatinine 0.44 - 1.00 mg/dL 0.82  0.76  0.79   Sodium 135 - 145 mmol/L 135  137  138  Potassium 3.5 - 5.1 mmol/L 4.0  4.2  3.7   Chloride 98 - 111 mmol/L 104  107  110   CO2 22 - 32 mmol/L '22  25  21   '$ Calcium 8.9 - 10.3 mg/dL 9.3  9.2  8.4   Total Protein 6.0 - 8.3 g/dL  7.4  7.0   Total Bilirubin 0.2 - 1.2 mg/dL  0.4  0.3   Alkaline Phos 39 - 117 U/L  43  41   AST 0 - 37 U/L  18  16   ALT 0 - 35 U/L  12  11        Carmell Austria, MD 12/08/2022, 7:35 AM  Cc: Buckley, [place] J, PA-C

## 2022-12-08 NOTE — Op Note (Signed)
[place] Patient Name: Xina Counihan Procedure Date: 12/08/2022 7:34 AM MRN: [id number] Endoscopist: Jackquline Denmark , MD, [id number] Age: 22 Referring MD:  Date of Birth: 2001-04-19 Gender: Female Account #: [id number] Procedure:                Upper GI endoscopy Indications:              Iron deficiency anemia Medicines:                Monitored Anesthesia Care Procedure:                Pre-Anesthesia Assessment:                           - Prior to the procedure, a History and Physical                            was performed, and patient medications and                            allergies were reviewed. The patient's tolerance of                            previous anesthesia was also reviewed. The risks                            and benefits of the procedure and the sedation                            options and risks were discussed with the patient.                            All questions were answered, and informed consent                            was obtained. Prior Anticoagulants: The patient has                            taken no anticoagulant or antiplatelet agents. ASA                            Grade Assessment: II - A patient with mild systemic                            disease. After reviewing the risks and benefits,                            the patient was deemed in satisfactory condition to                            undergo the procedure.                           After obtaining informed consent, the endoscope was  passed under direct vision. Throughout the                            procedure, the patient's blood pressure, pulse, and                            oxygen saturations were monitored continuously. The                            Olympus Scope [id number] was introduced through the                            mouth, and advanced to the second part of duodenum.                            The upper GI endoscopy was  accomplished without                            difficulty. The patient tolerated the procedure                            well. Scope In: Scope Out: Findings:                 The examined esophagus was normal.                           The Z-line was regular and was found 38 cm from the                            incisors.                           The entire examined stomach was normal. Biopsies                            were taken with a cold forceps for histology.                           The examined duodenum was normal. Biopsies for                            histology were taken with a cold forceps for                            evaluation of celiac disease. Complications:            No immediate complications. Estimated Blood Loss:     Estimated blood loss: none. Impression:               - Normal EGD. Recommendation:           - Patient has a contact number available for                            emergencies. The signs and symptoms of potential  delayed complications were discussed with the                            patient. Return to normal activities tomorrow.                            Written discharge instructions were provided to the                            patient.                           - Resume previous diet.                           - Continue present medications.                           - Await pathology results.                           - The findings and recommendations were discussed                            with the designated responsible adult. Jackquline Denmark, MD 12/08/2022 7:46:20 AM This report has been signed electronically.

## 2022-12-08 NOTE — Progress Notes (Signed)
Called to room to assist during endoscopic procedure.  Patient ID and intended procedure confirmed with present staff. Received instructions for my participation in the procedure from the performing physician.  

## 2022-12-08 NOTE — Patient Instructions (Signed)
YOU HAD AN ENDOSCOPIC PROCEDURE TODAY AT [facility]:   Refer to the procedure report that was given to you for any specific questions about what was found during the examination.  If the procedure report does not answer your questions, please call your gastroenterologist to clarify.  If you requested that your care partner not be given the details of your procedure findings, then the procedure report has been included in a sealed envelope for you to review at your convenience later.  YOU SHOULD EXPECT: Some feelings of bloating in the abdomen. Passage of more gas than usual.  Walking can help get rid of the air that was put into your GI tract during the procedure and reduce the bloating. If you had a lower endoscopy (such as a colonoscopy or flexible sigmoidoscopy) you may notice spotting of blood in your stool or on the toilet paper. If you underwent a bowel prep for your procedure, you may not have a normal bowel movement for a few days.  Please Note:  You might notice some irritation and congestion in your nose or some drainage.  This is from the oxygen used during your procedure.  There is no need for concern and it should clear up in a day or so.  SYMPTOMS TO REPORT IMMEDIATELY:  Following lower endoscopy (colonoscopy or flexible sigmoidoscopy):  Excessive amounts of blood in the stool  Significant tenderness or worsening of abdominal pains  Swelling of the abdomen that is new, acute  Fever of 100F or higher  Following upper endoscopy (EGD)  Vomiting of blood or coffee ground material  New chest pain or pain under the shoulder blades  Painful or persistently difficult swallowing  New shortness of breath  Fever of 100F or higher  Black, tarry-looking stools  For urgent or emergent issues, a gastroenterologist can be reached at any hour by calling [phone]. Do not use MyChart messaging for urgent concerns.    DIET:  We do recommend a small meal at first, but  then you may proceed to your regular diet.  Drink plenty of fluids but you should avoid alcoholic beverages for 24 hours.  MEDICATIONS: Continue present medications.  FOLLOW UP: Await pathology results. Repeat colonoscopy at age 22 as part of colorectal cancer screening. Earlier, if with any new problems. For Next colonoscopy, you will need a 2 day prep.  HANDOUTS GIVEN TO PATIENT: Hemorrhoids.  Thank you for allowing [place] to provide for your healthcare needs today.  ACTIVITY:  You should plan to take it easy for the rest of today and you should NOT DRIVE or use heavy machinery until tomorrow (because of the sedation medicines used during the test).    FOLLOW UP: Our staff will call the number listed on your records the next business day following your procedure.  We will call around 7:15- 8:00 am to check on you and address any questions or concerns that you may have regarding the information given to you following your procedure. If we do not reach you, we will leave a message.     If any biopsies were taken you will be contacted by phone or by letter within the next 1-3 weeks.  Please call us at [phone] if you have not heard about the biopsies in 3 weeks.    SIGNATURES/CONFIDENTIALITY: You and/or your care partner have signed paperwork which will be entered into your electronic medical record.  These signatures attest to the fact that that the information above on  your After Visit Summary has been reviewed and is understood.  Full responsibility of the confidentiality of this discharge information lies with you and/or your care-partner. 

## 2022-12-08 NOTE — Op Note (Signed)
[place] Patient Name: Heather Goodman Procedure Date: 12/08/2022 7:33 AM MRN: [id number] Endoscopist: Jackquline Denmark , MD, [id number] Age: 22 Referring MD:  Date of Birth: 03/22/2001 Gender: Female Account #: [id number] Procedure:                Colonoscopy Indications:              Intermittent diarrhea alternating with                            constipation. Iron deficiency anemia Medicines:                Monitored Anesthesia Care Procedure:                Pre-Anesthesia Assessment:                           - Prior to the procedure, a History and Physical                            was performed, and patient medications and                            allergies were reviewed. The patient's tolerance of                            previous anesthesia was also reviewed. The risks                            and benefits of the procedure and the sedation                            options and risks were discussed with the patient.                            All questions were answered, and informed consent                            was obtained. Prior Anticoagulants: The patient has                            taken no anticoagulant or antiplatelet agents. ASA                            Grade Assessment: II - A patient with mild systemic                            disease. After reviewing the risks and benefits,                            the patient was deemed in satisfactory condition to                            undergo the procedure.  After obtaining informed consent, the colonoscope                            was passed under direct vision. Throughout the                            procedure, the patient's blood pressure, pulse, and                            oxygen saturations were monitored continuously. The                            Olympus [id number] Colonoscope was                            introduced through the anus and  advanced to the 2                            cm into the ileum. The colonoscopy was performed                            without difficulty. The patient tolerated the                            procedure well. The quality of the bowel                            preparation was adequate to identify polyps. Some                            retained stool throughout the colon. Aggressive                            suctioning aspiration was performed. Overall over                            90-95% of the colonic mucosa was visualized                            satisfactorily. The examination was adequate. Of                            note that small and flat lesions could have been                            missed. The terminal ileum, ileocecal valve,                            appendiceal orifice, and rectum were photographed. Scope In: 7:47:59 AM Scope Out: 7:58:43 AM Scope Withdrawal Time: 0 hours 8 minutes 31 seconds  Total Procedure Duration: 0 hours 10 minutes 44 seconds  Findings:                 The colon (entire examined portion) appeared  normal. Biopsies for histology were taken with a                            cold forceps from the entire colon for evaluation                            of microscopic colitis.                           The terminal ileum appeared normal. Biopsies were                            taken with a cold forceps for histology.                           Non-bleeding internal hemorrhoids were found during                            retroflexion. The hemorrhoids were small and Grade                            I (internal hemorrhoids that do not prolapse).                           The exam was otherwise without abnormality on                            direct and retroflexion views. Complications:            No immediate complications. Estimated Blood Loss:     Estimated blood loss: none. Impression:               -Minimal internal  hemorrhoids.                           -Otherwise normal colonoscopy to TI. Recommendation:           - Patient has a contact number available for                            emergencies. The signs and symptoms of potential                            delayed complications were discussed with the                            patient. Return to normal activities tomorrow.                            Written discharge instructions were provided to the                            patient.                           - Resume previous diet.                           -  Continue present medications.                           - Await pathology results.                           - Repeat colonoscopy at age 1 as part of                            colorectal cancer screening. Earlier, if with any                            new problems. (Pl note)For the next colonoscopy,                            she will need a 2-day prep.                           - FU PRN.                           - The findings and recommendations were discussed                            with the patient's family. Jackquline Denmark, MD 12/08/2022 8:05:11 AM This report has been signed electronically.

## 2022-12-09 ENCOUNTER — Telehealth: Payer: Self-pay | Admitting: *Deleted

## 2022-12-09 NOTE — Telephone Encounter (Signed)
  Follow up Call-     12/08/2022    7:06 AM 11/28/2022    2:25 PM  Call back number  Post procedure Call Back phone  # [phone]  Permission to leave phone message Yes Yes    LMOM

## 2022-12-13 ENCOUNTER — Encounter: Payer: Self-pay | Admitting: Gastroenterology

## 2022-12-22 ENCOUNTER — Ambulatory Visit (INDEPENDENT_AMBULATORY_CARE_PROVIDER_SITE_OTHER): Admitting: Dermatology

## 2022-12-22 VITALS — BP 129/83

## 2022-12-22 DIAGNOSIS — L810 Postinflammatory hyperpigmentation: Secondary | ICD-10-CM

## 2022-12-22 DIAGNOSIS — L700 Acne vulgaris: Secondary | ICD-10-CM

## 2022-12-22 MED ORDER — TRETINOIN 0.025 % EX CREA: TOPICAL_CREAM | Freq: Every day | CUTANEOUS | 3 refills | Status: AC

## 2022-12-22 MED ORDER — AZELAIC ACID 20 % EX CREA: TOPICAL_CREAM | Freq: Every day | CUTANEOUS | 3 refills | Status: AC

## 2022-12-22 NOTE — Patient Instructions (Signed)
Daily Regimen Template:  Morning: 1. Wash your face with a gentle cleanser (Cetaphil, CeraVe, Neutrogena) 2. Apply a pea-sized amount of  azelaic acid cream to face 3. Follow with a moisturizer and sunscreen suitable for acne-prone skin.  Evening: 1. Wash your face with a gentle cleanser (Cetaphil, CeraVe, Neutrogena) 2. Apply a pea-sized amount of  tretinoin 0.025% cream  to entire face.  Start only using 2-3 night per week and gradually increase as tolerated. 3. Apply a non-comedogenic moisturizer to keep the skin hydrated overnight (Neutrogena, CeraVe, Cetaphil)  Note: Always follow your dermatologist's recommendations and treatment plan for best results. Consistency is key to managing acne effectively. If you experience any severe side effects or worsening of symptoms, consult your healthcare provider promptly.

## 2022-12-22 NOTE — Progress Notes (Unsigned)
   New Patient Visit  Subjective  Heather Goodman is a 22 y.o. female who presents for the following: Acne (Breaking out over cheeks - she is washing with Panoxyl 4% creamy wash, using 2% salicylic acid from Panoxyl. She is also using La Roche-Posay eczema cream.Started getting worse when she started a new birth control.).    The following portions of the chart were reviewed this encounter and updated as appropriate:   Tobacco  Allergies  Meds  Problems  Med Hx  Surg Hx  Fam Hx      Review of Systems:  No other skin or systemic complaints except as noted in HPI or Assessment and Plan.  Objective  Well appearing patient in no apparent distress; mood and affect are within normal limits.  A focused examination was performed including face. Relevant physical exam findings are noted in the Assessment and Plan.  face Inflammatory papules and pustules  Face post-brown macules in areas of prior acne lesions       Assessment & Plan  Acne vulgaris face  Daily Regimen Template:  Morning: 1. Wash your face with a gentle cleanser (Cetaphil, CeraVe, Neutrogena) 2. Apply a pea-sized amount of  azelaic acid cream to face 3. Follow with a moisturizer and sunscreen suitable for acne-prone skin.  Evening: 1. Wash your face with a gentle cleanser (Cetaphil, CeraVe, Neutrogena) 2. Apply a pea-sized amount of  tretinoin 0.025% cream to entire face.  Start only using 2-3 night per week and gradually increase as tolerated. 3. Apply a non-comedogenic moisturizer to keep the skin hydrated overnight (Neutrogena, CeraVe, Cetaphil)  Note: Always follow your dermatologist's recommendations and treatment plan for best results. Consistency is key to managing acne effectively. If you experience any severe side effects or worsening of symptoms, consult your healthcare provider promptly.   azelaic acid (AZELEX) 20 % cream - face Apply topically daily. After skin is thoroughly washed and  patted dry, gently but thoroughly massage a thin film of azelaic acid cream into the affected area twice daily, in the morning and evening.  tretinoin (RETIN-A) 0.025 % cream - face Apply topically at bedtime. Apply to face at bedtime 3 times per week as directed  Postinflammatory hyperpigmentation Face  Start Azelic acid cream qam   Plan: Counseling I counseled the patient regarding the following: Skin care: Recommend minimizing sun exposure, wearing sunscreen and protective clothing. / Expectations: Post Inflammatory pigmentary change is lighter or darker discoloration than surrounding skin resulting from trauma or rashes. Areas tend to normalize over time, but can take months to years.  I recommended the following: Broad Spectrum Sunscreen SPF 30+ Topical Retinoids    Return in about 3 months (around 03/24/2023) for Acne.   I, Ashok Cordia, CMA, am acting as scribe for Ellard Artis, MD .  Documentation: I have reviewed the above documentation for accuracy and completeness, and I agree with the above  [place], DO

## 2022-12-23 ENCOUNTER — Other Ambulatory Visit: Payer: Self-pay

## 2022-12-23 ENCOUNTER — Encounter: Payer: Self-pay | Admitting: Dermatology

## 2022-12-23 DIAGNOSIS — L700 Acne vulgaris: Secondary | ICD-10-CM

## 2022-12-23 MED ORDER — TRETINOIN 0.025 % EX GEL: Freq: Every day | CUTANEOUS | 2 refills | Status: AC

## 2022-12-26 ENCOUNTER — Encounter: Payer: Self-pay | Admitting: Oncology

## 2022-12-26 ENCOUNTER — Encounter: Payer: Self-pay | Admitting: *Deleted

## 2022-12-26 ENCOUNTER — Inpatient Hospital Stay (HOSPITAL_BASED_OUTPATIENT_CLINIC_OR_DEPARTMENT_OTHER): Admitting: Oncology

## 2022-12-26 ENCOUNTER — Inpatient Hospital Stay: Attending: Oncology

## 2022-12-26 ENCOUNTER — Other Ambulatory Visit: Payer: Self-pay

## 2022-12-26 ENCOUNTER — Other Ambulatory Visit: Payer: Self-pay | Admitting: *Deleted

## 2022-12-26 VITALS — BP 126/79 | HR 66 | Temp 97.5°F | Resp 14 | Ht 68.0 in | Wt 227.4 lb

## 2022-12-26 DIAGNOSIS — Z841 Family history of disorders of kidney and ureter: Secondary | ICD-10-CM | POA: Diagnosis not present

## 2022-12-26 DIAGNOSIS — D259 Leiomyoma of uterus, unspecified: Secondary | ICD-10-CM | POA: Insufficient documentation

## 2022-12-26 DIAGNOSIS — Z8379 Family history of other diseases of the digestive system: Secondary | ICD-10-CM | POA: Diagnosis not present

## 2022-12-26 DIAGNOSIS — D510 Vitamin B12 deficiency anemia due to intrinsic factor deficiency: Secondary | ICD-10-CM

## 2022-12-26 DIAGNOSIS — Z79624 Long term (current) use of inhibitors of nucleotide synthesis: Secondary | ICD-10-CM | POA: Diagnosis not present

## 2022-12-26 DIAGNOSIS — R5383 Other fatigue: Secondary | ICD-10-CM | POA: Diagnosis not present

## 2022-12-26 DIAGNOSIS — R0609 Other forms of dyspnea: Secondary | ICD-10-CM | POA: Insufficient documentation

## 2022-12-26 DIAGNOSIS — Z8619 Personal history of other infectious and parasitic diseases: Secondary | ICD-10-CM | POA: Insufficient documentation

## 2022-12-26 DIAGNOSIS — K921 Melena: Secondary | ICD-10-CM | POA: Diagnosis not present

## 2022-12-26 DIAGNOSIS — N920 Excessive and frequent menstruation with regular cycle: Secondary | ICD-10-CM | POA: Insufficient documentation

## 2022-12-26 DIAGNOSIS — Z886 Allergy status to analgesic agent status: Secondary | ICD-10-CM | POA: Insufficient documentation

## 2022-12-26 DIAGNOSIS — Z79899 Other long term (current) drug therapy: Secondary | ICD-10-CM | POA: Diagnosis not present

## 2022-12-26 DIAGNOSIS — I3139 Other pericardial effusion (noninflammatory): Secondary | ICD-10-CM | POA: Insufficient documentation

## 2022-12-26 DIAGNOSIS — N938 Other specified abnormal uterine and vaginal bleeding: Secondary | ICD-10-CM | POA: Diagnosis not present

## 2022-12-26 DIAGNOSIS — K640 First degree hemorrhoids: Secondary | ICD-10-CM | POA: Diagnosis not present

## 2022-12-26 DIAGNOSIS — K5900 Constipation, unspecified: Secondary | ICD-10-CM | POA: Diagnosis not present

## 2022-12-26 DIAGNOSIS — D509 Iron deficiency anemia, unspecified: Secondary | ICD-10-CM | POA: Diagnosis present

## 2022-12-26 DIAGNOSIS — Q613 Polycystic kidney, unspecified: Secondary | ICD-10-CM | POA: Insufficient documentation

## 2022-12-26 DIAGNOSIS — F5089 Other specified eating disorder: Secondary | ICD-10-CM | POA: Insufficient documentation

## 2022-12-26 DIAGNOSIS — D6800 Von Willebrand disease, unspecified: Secondary | ICD-10-CM | POA: Insufficient documentation

## 2022-12-26 DIAGNOSIS — D539 Nutritional anemia, unspecified: Secondary | ICD-10-CM

## 2022-12-26 DIAGNOSIS — E538 Deficiency of other specified B group vitamins: Secondary | ICD-10-CM

## 2022-12-26 LAB — CBC WITH DIFFERENTIAL (CANCER CENTER ONLY)
Abs Immature Granulocytes: 0.01 10*3/uL (ref 0.00–0.07)
Basophils Absolute: 0 10*3/uL (ref 0.0–0.1)
Basophils Relative: 1 %
Eosinophils Absolute: 0.2 10*3/uL (ref 0.0–0.5)
Eosinophils Relative: 4 %
HCT: 38.6 % (ref 36.0–46.0)
Hemoglobin: 12.9 g/dL (ref 12.0–15.0)
Immature Granulocytes: 0 %
Lymphocytes Relative: 32 %
Lymphs Abs: 1.8 10*3/uL (ref 0.7–4.0)
MCH: 27.5 pg (ref 26.0–34.0)
MCHC: 33.4 g/dL (ref 30.0–36.0)
MCV: 82.3 fL (ref 80.0–100.0)
Monocytes Absolute: 0.4 10*3/uL (ref 0.1–1.0)
Monocytes Relative: 7 %
Neutro Abs: 3.2 10*3/uL (ref 1.7–7.7)
Neutrophils Relative %: 56 %
Platelet Count: 170 10*3/uL (ref 150–400)
RBC: 4.69 MIL/uL (ref 3.87–5.11)
RDW: 20.9 % — ABNORMAL HIGH (ref 11.5–15.5)
WBC Count: 5.7 10*3/uL (ref 4.0–10.5)
nRBC: 0 % (ref 0.0–0.2)

## 2022-12-26 NOTE — Patient Instructions (Signed)
Please begin Solgar gentle iron or Petra Kuba made iron daily or every other day.  Please begin vitamin B12 1000 micrograms daily under the tongue (sublingual) These are available over the counter.

## 2022-12-26 NOTE — Progress Notes (Unsigned)
[facility] Follow up Visit:  Patient Care Team: Buckley, [place] J, PA-C as PCP - General (Physician Assistant) Barbee Cough, MD as Consulting Physician (Internal Medicine)  CHIEF COMPLAINTS/PURPOSE OF CONSULTATION:  HISTORY OF PRESENTING ILLNESS: Heather Goodman 22 y.o. female is here because of  anemia August 18, 2022: CT abdomen pelvis Possible thickening of left upper quadrant jejunal loops which may be secondary to enteritis of infectious or inflammatory etiology.  Polycystic kidneys. Small pericardial effusion.  August 18, 2022:  Folate 8 B12 109  September 09, 2022:   Stool for C. difficile positive.  Patient had not been on Abx prior to the diagnosis but had been undergoing CNA training  CT abdomen pelvis uterus and adnexa unremarkable.  No mass.  No evidence of Crohn's enteritis  October 10, 2022: WBC 5.9 hemoglobin 9.3 MCV 69 platelet count 374; 64 segs 27 lymphs 8 monos 2 eos 1 basophil Sed rate 28 CRP undetectable B12 169 ferritin 3  October 24 2022:  [facility]  Patient is G0 P0 Menopause not reached.  Last period was June 04 2022, she is on Nexplanon.  Prior to being placed on contraception she had heavy menses which occurred monthly and lasted 1 week.  Periods were regular.  Did not usually have bleeding between periods.    Patient does not have history of uterine fibroids/uterine abnormalities.   Was placed on oral iron at 67 yrs of age but was not consistent with it.  Has received PRBC's on 2 occasions.  Has never received IV iron.  Develops constipation with oral iron.     Has a normal diet.  No history of hemorrhage following loss deciduous teeth.  Has hematochezia but does not pass mucus.  No history of intra-articular or soft tissue bleeding No history of abnormal bleeding in family members except for fibroids and heavy menses in female family members.  Patient has symptoms of fatigue, pallor,  DOE, decreased  performance status, intolerance to cold.  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  Tobacco none.  EtOH rare  [facility] Mother alive 40 DM Type II, fibroids, HTN Father alive 23 obesity Sisters alive 5 and 13 with polycystic kidney disease Brother alive 1 well  WBC 5.1 hemoglobin 10.1 MCV 72 platelet count 323; 71 segs 23 lymphs 5 monos 1 basophil Coombs test negative haptoglobin 96 Hemoglobin electrophoresis normal adult pattern Intrinsic factor blocking antibody 14.5 antiparietal cell antibody negative Folate 11.2  Fibrinogen 365 INR 1.0 PTT 31  October 24 2022:  Began Vitamin B12 replacement sq November 08 2022:  Feraheme 510 mg IV  November 14 2022:  Scheduled follow up for anemia.  Feels a bit better since receiving B12 and IV iron.  Pica has improved. Reviewed results of labs with patient.  Still having abdominal pain.   TSH 3.338 Free T40.71 Tissue transglutaminase antibody negative IgA 168  November 15 2022:  Feraheme 510 mg IV  December 08, 2022: EGD normal.  Colonoscopy demonstrated some grade 1 hemorrhoids  December 26, 2022: Scheduled follow-up for anemia.  Energy level good.  Reviewed results of labs with patient.  Not taking oral iron or B12.  Just began menses today.   Hgb 12.9 today.    Review of Systems - Oncology  MEDICAL HISTORY: Past Medical History:  Diagnosis Date   Ovarian cyst    Polycystic kidney disease, childhood type (CPKD)  SURGICAL HISTORY: No past surgical history on file.  SOCIAL HISTORY: Social History   Socioeconomic History   Marital status: Single    Spouse name: Not on file   Number of children: 0   Years of education: Not on file   Highest education level: Not on file  Occupational History   Occupation: student  Tobacco Use   Smoking status: Never   Smokeless tobacco: Never  Vaping Use   Vaping Use: Never used  Substance and Sexual Activity   Alcohol use: Never    Drug use: Never   Sexual activity: Yes    Birth control/protection: Implant  Other Topics Concern   Not on file  Social History Narrative   Not on file   Social Determinants of Health   Financial Resource Strain: Not on file  Food Insecurity: Not on file  Transportation Needs: Not on file  Physical Activity: Not on file  Stress: Not on file  Social Connections: Not on file  Intimate Partner Violence: Not on file    FAMILY HISTORY Family History  Problem Relation Age of Onset   Kidney disease Mother    Kidney disease Sister    Crohn's disease Paternal Aunt    Stomach cancer Neg Hx    Colon cancer Neg Hx    Esophageal cancer Neg Hx     ALLERGIES:  is allergic to other, aspirin, bergera koenigii, and latex.  MEDICATIONS:  Current Outpatient Medications  Medication Sig Dispense Refill   tretinoin (RETIN-A) 0.025 % gel Apply topically at bedtime. 45 g 2   acetaminophen (TYLENOL) 325 MG tablet Take 650 mg by mouth every 6 (six) hours as needed for moderate pain or headache.     amphetamine-dextroamphetamine (ADDERALL XR) 10 MG 24 hr capsule 1 capsule in the morning Orally Once a day (Patient not taking: Reported on 11/28/2022)     azelaic acid (AZELEX) 20 % cream Apply topically daily. After skin is thoroughly washed and patted dry, gently but thoroughly massage a thin film of azelaic acid cream into the affected area twice daily, in the morning and evening. 30 g 3   cyanocobalamin (VITAMIN B12) 1000 MCG/ML injection Inject 55ml every 14 days for 1 month and then 40ml monthly (Patient not taking: Reported on 12/08/2022) 14 mL 0   dicyclomine (BENTYL) 20 MG tablet Take 1 tablet (20 mg total) by mouth 2 (two) times daily. 20 tablet 0   ferrous sulfate 325 (65 FE) MG tablet Take 1 tablet (325 mg total) by mouth daily. 30 tablet 0   hydrocortisone (ANUSOL-HC) 25 MG suppository      Multiple Vitamins-Minerals (ONE-A-DAY WOMENS) tablet Take 1 tablet by mouth daily.     nifedipine 0.3 %  ointment Place 1 Application rectally 4 (four) times daily. 30 g 0   ondansetron (ZOFRAN) 4 MG tablet Take 1 tablet (4 mg total) by mouth every 8 (eight) hours as needed for nausea or vomiting. 20 tablet 0   phentermine (ADIPEX-P) 37.5 MG tablet Take 37.5 mg by mouth every morning.     polyethylene glycol powder (GLYCOLAX/MIRALAX) 17 GM/SCOOP powder Take 255 g by mouth as directed. 255 g 0   tretinoin (RETIN-A) 0.025 % cream Apply topically at bedtime. Apply to face at bedtime 3 times per week as directed 45 g 3   valACYclovir (VALTREX) 500 MG tablet Take 500 mg by mouth daily.     vancomycin (VANCOCIN) 125 MG capsule Take by mouth. (Patient not taking: Reported on 12/08/2022)  Vitamin D, Ergocalciferol, (DRISDOL) 1.25 MG (50000 UNIT) CAPS capsule Take 50,000 Units by mouth every Wednesday.     Current Facility-Administered Medications  Medication Dose Route Frequency Provider Last Rate Last Admin   0.9 %  sodium chloride infusion  500 mL Intravenous Once Jackquline Denmark, MD       0.9 %  sodium chloride infusion  500 mL Intravenous Once Jackquline Denmark, MD        PHYSICAL EXAMINATION:  ECOG PERFORMANCE STATUS: 1 - Symptomatic but completely ambulatory   There were no vitals filed for this visit.   There were no vitals filed for this visit.    Physical Exam Vitals and nursing note reviewed.  Constitutional:      General: She is not in acute distress.    Appearance: Normal appearance. She is not ill-appearing, toxic-appearing or diaphoretic.     Comments: Here alone.  Aunt present by telephone.    HENT:     Head: Normocephalic and atraumatic.     Right Ear: External ear normal.     Left Ear: External ear normal.     Nose: Nose normal. No congestion or rhinorrhea.  Eyes:     General: No scleral icterus.    Extraocular Movements: Extraocular movements intact.     Conjunctiva/sclera: Conjunctivae normal.     Pupils: Pupils are equal, round, and reactive to light.  Cardiovascular:      Rate and Rhythm: Normal rate.     Heart sounds: Normal heart sounds. No murmur heard.    No friction rub. No gallop.  Pulmonary:     Effort: Pulmonary effort is normal. No respiratory distress.     Breath sounds: Normal breath sounds. No wheezing or rhonchi.  Abdominal:     General: Bowel sounds are normal.     Palpations: Abdomen is soft.     Tenderness: There is no abdominal tenderness. There is no guarding or rebound.  Musculoskeletal:        General: No swelling, tenderness or deformity.     Cervical back: Normal range of motion and neck supple. No rigidity or tenderness.  Lymphadenopathy:     Head:     Right side of head: No submental, submandibular, tonsillar, preauricular, posterior auricular or occipital adenopathy.     Left side of head: No submental, submandibular, tonsillar, preauricular, posterior auricular or occipital adenopathy.     Cervical: No cervical adenopathy.     Right cervical: No superficial, deep or posterior cervical adenopathy.    Left cervical: No superficial, deep or posterior cervical adenopathy.     Upper Body:     Right upper body: No supraclavicular, axillary, pectoral or epitrochlear adenopathy.     Left upper body: No supraclavicular, axillary, pectoral or epitrochlear adenopathy.  Skin:    General: Skin is warm.     Coloration: Skin is not jaundiced or pale.  Neurological:     General: No focal deficit present.     Mental Status: She is alert and oriented to person, place, and time.     Cranial Nerves: No cranial nerve deficit.  Psychiatric:        Mood and Affect: Mood normal.        Behavior: Behavior normal.        Thought Content: Thought content normal.        Judgment: Judgment normal.      LABORATORY DATA: I have personally reviewed the data as listed:  No visits with results within 1 Month(s) from  this visit.  Latest known visit with results is:  Admission on 11/15/2022, Discharged on 11/15/2022  Component Date Value Ref  Range Status   WBC 11/15/2022 6.7  4.0 - 10.5 K/uL Final   RBC 11/15/2022 4.72  3.87 - 5.11 MIL/uL Final   Hemoglobin 11/15/2022 11.1 (L)  12.0 - 15.0 g/dL Final   HCT 11/15/2022 35.9 (L)  36.0 - 46.0 % Final   MCV 11/15/2022 76.1 (L)  80.0 - 100.0 fL Final   MCH 11/15/2022 23.5 (L)  26.0 - 34.0 pg Final   MCHC 11/15/2022 30.9  30.0 - 36.0 g/dL Final   RDW 11/15/2022 20.7 (H)  11.5 - 15.5 % Final   Platelets 11/15/2022 367  150 - 400 K/uL Final   nRBC 11/15/2022 0.0  0.0 - 0.2 % Final   Performed at [facility], [place] [address] [place], [place] [address]   Sodium 11/15/2022 135  135 - 145 mmol/L Final   Potassium 11/15/2022 4.0  3.5 - 5.1 mmol/L Final   Chloride 11/15/2022 104  98 - 111 mmol/L Final   CO2 11/15/2022 22  22 - 32 mmol/L Final   Glucose, Bld 11/15/2022 89  70 - 99 mg/dL Final   Glucose reference range applies only to samples taken after fasting for at least 8 hours.   BUN 11/15/2022 8  6 - 20 mg/dL Final   Creatinine, Ser 11/15/2022 0.82  0.44 - 1.00 mg/dL Final   Calcium 11/15/2022 9.3  8.9 - 10.3 mg/dL Final   GFR, Estimated 11/15/2022 >60  >60 mL/min Final   Comment: (NOTE) Calculated using the CKD-EPI Creatinine Equation (2021)    Anion gap 11/15/2022 9  5 - 15 Final   Performed at [facility], [place] [address], [place], [place] [address]   SARS Coronavirus 2 by RT PCR 11/15/2022 NEGATIVE  NEGATIVE Final   Comment: (NOTE) SARS-CoV-2 target nucleic acids are NOT DETECTED.  The SARS-CoV-2 RNA is generally detectable in upper respiratory specimens during the acute phase of infection. The lowest concentration of SARS-CoV-2 viral copies this assay can detect is 138 copies/mL. A negative result does not preclude SARS-Cov-2 infection and should not be used as the sole basis for treatment or other patient management decisions. A negative result may occur with  improper specimen collection/handling, submission of specimen other than  nasopharyngeal swab, presence of viral mutation(s) within the areas targeted by this assay, and inadequate number of viral copies(<138 copies/mL). A negative result must be combined with clinical observations, patient history, and epidemiological information. The expected result is Negative.  Fact Sheet for Patients:  EntrepreneurPulse.com.au  Fact Sheet for Healthcare Providers:  IncredibleEmployment.be  This test is no                          t yet approved or cleared by the [place] FDA and  has been authorized for detection and/or diagnosis of SARS-CoV-2 by FDA under an Emergency Use Authorization (EUA). This EUA will remain  in effect (meaning this test can be used) for the duration of the COVID-19 declaration under Section 564(b)(1) of the Act, 21 U.S.C.section 360bbb-3(b)(1), unless the authorization is terminated  or revoked sooner.       Influenza A by PCR 11/15/2022 NEGATIVE  NEGATIVE Final   Influenza B by PCR 11/15/2022 NEGATIVE  NEGATIVE Final   Comment: (NOTE) The Xpert Xpress SARS-CoV-2/FLU/RSV plus assay is intended as an aid in the diagnosis of  influenza from Nasopharyngeal swab specimens and should not be used as a sole basis for treatment. Nasal washings and aspirates are unacceptable for Xpert Xpress SARS-CoV-2/FLU/RSV testing.  Fact Sheet for Patients: EntrepreneurPulse.com.au  Fact Sheet for Healthcare Providers: IncredibleEmployment.be  This test is not yet approved or cleared by the [place] FDA and has been authorized for detection and/or diagnosis of SARS-CoV-2 by FDA under an Emergency Use Authorization (EUA). This EUA will remain in effect (meaning this test can be used) for the duration of the COVID-19 declaration under Section 564(b)(1) of the Act, 21 U.S.C. section 360bbb-3(b)(1), unless the authorization is terminated or revoked.     Resp Syncytial Virus by  PCR 11/15/2022 NEGATIVE  NEGATIVE Final   Comment: (NOTE) Fact Sheet for Patients: EntrepreneurPulse.com.au  Fact Sheet for Healthcare Providers: IncredibleEmployment.be  This test is not yet approved or cleared by the [place] FDA and has been authorized for detection and/or diagnosis of SARS-CoV-2 by FDA under an Emergency Use Authorization (EUA). This EUA will remain in effect (meaning this test can be used) for the duration of the COVID-19 declaration under Section 564(b)(1) of the Act, 21 U.S.C. section 360bbb-3(b)(1), unless the authorization is terminated or revoked.  Performed at [facility], [place] [address] [place], [place] [address]    I-stat hCG, quantitative 11/15/2022 <5.0  <5 mIU/mL Final   Comment 3 11/15/2022          Final   Comment:   GEST. AGE      CONC.  (mIU/mL)   <=1 WEEK        5 - 50     2 WEEKS       50 - 500     3 WEEKS       100 - 10,000     4 WEEKS     1,000 - 30,000        FEMALE AND NON-PREGNANT FEMALE:     LESS THAN 5 mIU/mL     RADIOGRAPHIC STUDIES: I have personally reviewed the radiological images as listed and agree with the findings in the report  No results found.  ASSESSMENT/PLAN  Patient is a 22 year old female with symptomatic microcytic anemia who has a history of dysfunctional uterine bleeding.  Patient also has vitamin B12 deficiency and a recent history of C diff colitis  Anemia:  Due to iron deficiency owing to prior dysfunctional uterine bleeding exacerbated by B12 deficiency.    October 24 2022:  Began Vitamin B12 replacement sq November 08 2022:  Feraheme 510 mg IV November 15 2022: Feraheme 510 mg IV  December 26 2022-  Recommended patient begin Solgar gentle iron or Petra Kuba made iron daily or every other day and vitamin B12 1000 micrograms sub lingual    Dysfunctional uterine bleeding:  This is characterized by menses that are prolonged, irregular as well as by heavy  bleeding with passage of clots.   October 24 2022:  PLT count, PT, PTT, Fibrinogen, von Willebrand screen normal.  on Nexplanon per gynecology with control of menses  Vitamin B12 deficiency:   October 24 2022:  Intrinsic factor blocking Ab positive.  Antiparietal cell Ab negative Began parenteral B12 replacement.    C diff colitis:  Likely acquired via occupational exposure.  Management per GI  Hematochezia  December 08 2022- EGD normal. Colonoscopy demonstrated some grade 1 hemorrhoids     Cancer Staging  No matching staging information was found for the patient.   No problem-specific Assessment &  Plan notes found for this encounter.   No orders of the defined types were placed in this encounter.  20  minutes was spent in patient care.  This included time spent preparing to see the patient (e.g., review of tests), obtaining and/or reviewing separately obtained history, counseling and educating the patient/family/caregiver, ordering medications, tests, or procedures; documenting clinical information in the electronic or other health record, independently interpreting results and communicating results to the patient/family/caregiver as well as coordination of care.       All questions were answered. The patient knows to call the clinic with any problems, questions or concerns.  This note was electronically signed.    Barbee Cough, MD  12/26/2022 1:20 PM

## 2022-12-31 ENCOUNTER — Encounter: Payer: Self-pay | Admitting: Oncology

## 2023-01-20 ENCOUNTER — Encounter: Admitting: Gastroenterology

## 2023-01-20 ENCOUNTER — Encounter: Payer: Self-pay | Admitting: Oncology

## 2023-01-22 ENCOUNTER — Telehealth: Payer: Self-pay | Admitting: Oncology

## 2023-02-03 ENCOUNTER — Telehealth: Payer: Self-pay | Admitting: Gastroenterology

## 2023-02-03 NOTE — Telephone Encounter (Signed)
PT is calling to find out if she can hav e a micro biome test done. Please advise.

## 2023-02-04 NOTE — Telephone Encounter (Unsigned)
Left message for pt to call back  °

## 2023-03-06 ENCOUNTER — Encounter: Payer: Self-pay | Admitting: Oncology

## 2023-03-13 ENCOUNTER — Telehealth: Payer: Self-pay | Admitting: Hematology and Oncology

## 2023-03-13 ENCOUNTER — Inpatient Hospital Stay: Payer: BC Managed Care – PPO | Attending: Oncology | Admitting: Oncology

## 2023-03-13 DIAGNOSIS — Z841 Family history of disorders of kidney and ureter: Secondary | ICD-10-CM | POA: Insufficient documentation

## 2023-03-13 DIAGNOSIS — D509 Iron deficiency anemia, unspecified: Secondary | ICD-10-CM | POA: Insufficient documentation

## 2023-03-13 DIAGNOSIS — K5900 Constipation, unspecified: Secondary | ICD-10-CM | POA: Insufficient documentation

## 2023-03-13 DIAGNOSIS — E538 Deficiency of other specified B group vitamins: Secondary | ICD-10-CM

## 2023-03-13 DIAGNOSIS — F5089 Other specified eating disorder: Secondary | ICD-10-CM | POA: Diagnosis not present

## 2023-03-13 DIAGNOSIS — D539 Nutritional anemia, unspecified: Secondary | ICD-10-CM

## 2023-03-13 DIAGNOSIS — Z8379 Family history of other diseases of the digestive system: Secondary | ICD-10-CM | POA: Insufficient documentation

## 2023-03-13 DIAGNOSIS — Z886 Allergy status to analgesic agent status: Secondary | ICD-10-CM | POA: Insufficient documentation

## 2023-03-13 DIAGNOSIS — D259 Leiomyoma of uterus, unspecified: Secondary | ICD-10-CM | POA: Insufficient documentation

## 2023-03-13 DIAGNOSIS — I3139 Other pericardial effusion (noninflammatory): Secondary | ICD-10-CM | POA: Diagnosis not present

## 2023-03-13 DIAGNOSIS — D500 Iron deficiency anemia secondary to blood loss (chronic): Secondary | ICD-10-CM | POA: Diagnosis not present

## 2023-03-13 DIAGNOSIS — R200 Anesthesia of skin: Secondary | ICD-10-CM

## 2023-03-13 DIAGNOSIS — D510 Vitamin B12 deficiency anemia due to intrinsic factor deficiency: Secondary | ICD-10-CM

## 2023-03-13 DIAGNOSIS — Z833 Family history of diabetes mellitus: Secondary | ICD-10-CM | POA: Diagnosis not present

## 2023-03-13 DIAGNOSIS — D6800 Von Willebrand disease, unspecified: Secondary | ICD-10-CM | POA: Diagnosis not present

## 2023-03-13 DIAGNOSIS — R202 Paresthesia of skin: Secondary | ICD-10-CM

## 2023-03-13 DIAGNOSIS — Z8249 Family history of ischemic heart disease and other diseases of the circulatory system: Secondary | ICD-10-CM | POA: Diagnosis not present

## 2023-03-13 DIAGNOSIS — Z8619 Personal history of other infectious and parasitic diseases: Secondary | ICD-10-CM | POA: Insufficient documentation

## 2023-03-13 DIAGNOSIS — R5383 Other fatigue: Secondary | ICD-10-CM | POA: Insufficient documentation

## 2023-03-13 DIAGNOSIS — Z8349 Family history of other endocrine, nutritional and metabolic diseases: Secondary | ICD-10-CM | POA: Insufficient documentation

## 2023-03-13 DIAGNOSIS — Q613 Polycystic kidney, unspecified: Secondary | ICD-10-CM | POA: Diagnosis not present

## 2023-03-13 DIAGNOSIS — R0609 Other forms of dyspnea: Secondary | ICD-10-CM | POA: Diagnosis not present

## 2023-03-13 DIAGNOSIS — Z79899 Other long term (current) drug therapy: Secondary | ICD-10-CM | POA: Insufficient documentation

## 2023-03-13 DIAGNOSIS — N920 Excessive and frequent menstruation with regular cycle: Secondary | ICD-10-CM | POA: Insufficient documentation

## 2023-03-13 DIAGNOSIS — Z79624 Long term (current) use of inhibitors of nucleotide synthesis: Secondary | ICD-10-CM | POA: Diagnosis not present

## 2023-03-13 DIAGNOSIS — N938 Other specified abnormal uterine and vaginal bleeding: Secondary | ICD-10-CM | POA: Diagnosis not present

## 2023-03-13 DIAGNOSIS — K640 First degree hemorrhoids: Secondary | ICD-10-CM | POA: Diagnosis not present

## 2023-03-13 NOTE — Telephone Encounter (Signed)
Left patient a vm regarding upcoming appointments  

## 2023-03-13 NOTE — Progress Notes (Signed)
[facility] Cancer Follow up Visit:  Patient Care Team: Buckley, [place] J, PA-C as PCP - General (Physician Assistant) Loni Muse, MD as Consulting Physician (Internal Medicine)  CHIEF COMPLAINTS/PURPOSE OF CONSULTATION:  HISTORY OF PRESENTING ILLNESS: Heather Goodman 22 y.o. female is here because of  anemia August 18, 2022: CT abdomen pelvis Possible thickening of left upper quadrant jejunal loops which may be secondary to enteritis of infectious or inflammatory etiology.  Polycystic kidneys. Small pericardial effusion.  August 18, 2022:  Folate 8 B12 109  September 09, 2022:   Stool for C. difficile positive.  Patient had not been on Abx prior to the diagnosis but had been undergoing CNA training  CT abdomen pelvis uterus and adnexa unremarkable.  No mass.  No evidence of Crohn's enteritis  October 10, 2022: WBC 5.9 hemoglobin 9.3 MCV 69 platelet count 374; 64 segs 27 lymphs 8 monos 2 eos 1 basophil Sed rate 28 CRP undetectable B12 169 ferritin 3  October 24 2022:  [facility] Hematology Consult  Patient is G0 P0 Menopause not reached.  Last period was June 04 2022, she is on Nexplanon.  Prior to being placed on contraception she had heavy menses which occurred monthly and lasted 1 week.  Periods were regular.  Did not usually have bleeding between periods.    Patient does not have history of uterine fibroids/uterine abnormalities.   Was placed on oral iron at 14 yrs of age but was not consistent with it.  Has received PRBC's on 2 occasions.  Has never received IV iron.  Develops constipation with oral iron.     Has a normal diet.  No history of hemorrhage following loss deciduous teeth.  Has hematochezia but does not pass mucus.  No history of intra-articular or soft tissue bleeding No history of abnormal bleeding in family members except for fibroids and heavy menses in female family members.  Patient has symptoms of fatigue, pallor,  DOE, decreased  performance status, intolerance to cold.  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  Tobacco none.  EtOH rare  [facility] Mother alive 43 DM Type II, fibroids, HTN Father alive 50 obesity Sisters alive 90 and 8 with polycystic kidney disease Brother alive 20 well  WBC 5.1 hemoglobin 10.1 MCV 72 platelet count 323; 71 segs 23 lymphs 5 monos 1 basophil Coombs test negative haptoglobin 96 Hemoglobin electrophoresis normal adult pattern Intrinsic factor blocking antibody 14.5 antiparietal cell antibody negative Folate 11.2  Fibrinogen 365 INR 1.0 PTT 31  October 24 2022:  Began Vitamin B12 replacement sq November 08 2022:  Feraheme 510 mg IV  November 14 2022:  Scheduled follow up for anemia.  Feels a bit better since receiving B12 and IV iron.  Pica has improved. Reviewed results of labs with patient.  Still having abdominal pain.   TSH 3.338 Free T40.71 Tissue transglutaminase antibody negative IgA 168  November 15 2022:  Feraheme 510 mg IV  December 08, 2022: EGD normal.  Colonoscopy demonstrated some grade 1 hemorrhoids  December 26, 2022:  Energy level good.  Reviewed results of labs with patient.  Not taking oral iron or B12.  Just began menses today.   Hgb 12.9 today.    March 13, 2023:  Scheduled follow-up for anemia.   Telephone visit.  When sitting or leaning on arms hands and feet fall asleep.  May benefit from MRI of neck  and T spine.  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  Taking oral iron and B12 with occasional constipation.    Review of Systems - Oncology  MEDICAL HISTORY: Past Medical History:  Diagnosis Date   Ovarian cyst    Polycystic kidney disease, childhood type (CPKD)     SURGICAL HISTORY: No past surgical history on file.  SOCIAL HISTORY: Social History   Socioeconomic History   Marital status: Single    Spouse name: Not on file   Number of children: 0   Years of  education: Not on file   Highest education level: Not on file  Occupational History   Occupation: student  Tobacco Use   Smoking status: Never   Smokeless tobacco: Never  Vaping Use   Vaping Use: Never used  Substance and Sexual Activity   Alcohol use: Never   Drug use: Never   Sexual activity: Yes    Birth control/protection: Implant  Other Topics Concern   Not on file  Social History Narrative   Not on file   Social Determinants of Health   Financial Resource Strain: Not on file  Food Insecurity: Not on file  Transportation Needs: Not on file  Physical Activity: Not on file  Stress: Not on file  Social Connections: Not on file  Intimate Partner Violence: Not on file    FAMILY HISTORY Family History  Problem Relation Age of Onset   Kidney disease Mother    Kidney disease Sister    Crohn's disease Paternal Aunt    Stomach cancer Neg Hx    Colon cancer Neg Hx    Esophageal cancer Neg Hx     ALLERGIES:  is allergic to other, aspirin, bergera koenigii, and latex.  MEDICATIONS:  Current Outpatient Medications  Medication Sig Dispense Refill   acetaminophen (TYLENOL) 325 MG tablet Take 650 mg by mouth every 6 (six) hours as needed for moderate pain or headache.     azelaic acid (AZELEX) 20 % cream Apply topically daily. After skin is thoroughly washed and patted dry, gently but thoroughly massage a thin film of azelaic acid cream into the affected area twice daily, in the morning and evening. 30 g 3   dicyclomine (BENTYL) 20 MG tablet Take 1 tablet (20 mg total) by mouth 2 (two) times daily. 20 tablet 0   hydrocortisone (ANUSOL-HC) 25 MG suppository      nifedipine 0.3 % ointment Place 1 Application rectally 4 (four) times daily. 30 g 0   ondansetron (ZOFRAN) 4 MG tablet Take 1 tablet (4 mg total) by mouth every 8 (eight) hours as needed for nausea or vomiting. 20 tablet 0   phentermine (ADIPEX-P) 37.5 MG tablet Take 37.5 mg by mouth every morning.     polyethylene  glycol powder (GLYCOLAX/MIRALAX) 17 GM/SCOOP powder Take 255 g by mouth as directed. 255 g 0   tretinoin (RETIN-A) 0.025 % cream Apply topically at bedtime. Apply to face at bedtime 3 times per week as directed 45 g 3   tretinoin (RETIN-A) 0.025 % gel Apply topically at bedtime. 45 g 2   valACYclovir (VALTREX) 500 MG tablet Take 500 mg by mouth daily.     Vitamin D, Ergocalciferol, (DRISDOL) 1.25 MG (50000 UNIT) CAPS capsule Take 50,000 Units by mouth every Wednesday.     Current Facility-Administered Medications  Medication Dose Route Frequency Provider Last Rate Last Admin   0.9 %  sodium chloride infusion  500 mL Intravenous Once Lynann Bologna, MD  0.9 %  sodium chloride infusion  500 mL Intravenous Once Lynann Bologna, MD        PHYSICAL EXAMINATION:  ECOG PERFORMANCE STATUS: 1 - Symptomatic but completely ambulatory   There were no vitals filed for this visit.   There were no vitals filed for this visit.    Physical Exam  As part of a telephone visit today a physical exam could not be conducted  LABORATORY DATA: I have personally reviewed the data as listed:  No visits with results within 1 Month(s) from this visit.  Latest known visit with results is:  Appointment on 12/26/2022  Component Date Value Ref Range Status   WBC Count 12/26/2022 5.7  4.0 - 10.5 K/uL Final   RBC 12/26/2022 4.69  3.87 - 5.11 MIL/uL Final   Hemoglobin 12/26/2022 12.9  12.0 - 15.0 g/dL Final   HCT 16/07/9603 38.6  36.0 - 46.0 % Final   MCV 12/26/2022 82.3  80.0 - 100.0 fL Final   MCH 12/26/2022 27.5  26.0 - 34.0 pg Final   MCHC 12/26/2022 33.4  30.0 - 36.0 g/dL Final   RDW 54/06/8118 20.9 (H)  11.5 - 15.5 % Final   Platelet Count 12/26/2022 170  150 - 400 K/uL Final   nRBC 12/26/2022 0.0  0.0 - 0.2 % Final   Neutrophils Relative % 12/26/2022 56  % Final   Neutro Abs 12/26/2022 3.2  1.7 - 7.7 K/uL Final   Lymphocytes Relative 12/26/2022 32  % Final   Lymphs Abs 12/26/2022 1.8  0.7 - 4.0  K/uL Final   Monocytes Relative 12/26/2022 7  % Final   Monocytes Absolute 12/26/2022 0.4  0.1 - 1.0 K/uL Final   Eosinophils Relative 12/26/2022 4  % Final   Eosinophils Absolute 12/26/2022 0.2  0.0 - 0.5 K/uL Final   Basophils Relative 12/26/2022 1  % Final   Basophils Absolute 12/26/2022 0.0  0.0 - 0.1 K/uL Final   Immature Granulocytes 12/26/2022 0  % Final   Abs Immature Granulocytes 12/26/2022 0.01  0.00 - 0.07 K/uL Final   Performed at [facility], [address], [place], [place] [address]    RADIOGRAPHIC STUDIES: I have personally reviewed the radiological images as listed and agree with the findings in the report  No results found.  ASSESSMENT/PLAN  Patient is a 22 year old female with symptomatic microcytic anemia who has a history of dysfunctional uterine bleeding.  Patient also has vitamin B12 deficiency and a recent history of C diff colitis  Anemia:  Due to iron deficiency owing to prior dysfunctional uterine bleeding exacerbated by B12 deficiency.    October 24 2022:  Began Vitamin B12 replacement sq November 08 2022:  Feraheme 510 mg IV November 15 2022: Feraheme 510 mg IV  December 26 2022-  Recommended patient begin Solgar gentle iron or Ashby Dawes made iron daily or every other day and vitamin B12 1000 micrograms sub lingual March 13 2023- Tolerating low dose oral iron.       Dysfunctional uterine bleeding:  This is characterized by menses that are prolonged, irregular as well as by heavy bleeding with passage of clots.   October 24 2022:  PLT count, PT, PTT, Fibrinogen, von Willebrand screen normal.  on Nexplanon per gynecology with control of menses  Vitamin B12 deficiency:   October 24 2022:  Intrinsic factor blocking Ab positive.  Antiparietal cell Ab negative Began parenteral B12 replacement.    April 12 2023- Taking sublingual B12  C diff colitis:  Likely acquired via occupational exposure.  Management per GI  Hematochezia  December 08 2022- EGD normal. Colonoscopy demonstrated some grade 1 hemorrhoids   Numbness of hands and feet   March 13 2023- May benefit from MRI cervical and thoracic spine.  Encouraged patient to discuss this with PCP  Follow up  March 13 2023- Recommended in person follow up for next visit so that labs could be drawn    Cancer Staging  No matching staging information was found for the patient.   No problem-specific Assessment & Plan notes found for this encounter.   No orders of the defined types were placed in this encounter.  23  minutes was spent in patient care.  This included time spent preparing to see the patient (e.g., review of tests), obtaining and/or reviewing separately obtained history, counseling and educating the patient/family/caregiver, ordering medications, tests, or procedures; documenting clinical information in the electronic or other health record, independently interpreting results and communicating results to the patient/family/caregiver as well as coordination of care.       All questions were answered. The patient knows to call the clinic with any problems, questions or concerns.  This note was electronically signed.    Loni Muse, MD  03/13/2023 9:02 AM

## 2023-03-27 ENCOUNTER — Telehealth: Admitting: Oncology

## 2023-05-22 ENCOUNTER — Encounter: Payer: Self-pay | Admitting: Oncology

## 2023-05-28 ENCOUNTER — Other Ambulatory Visit: Payer: Self-pay | Admitting: Oncology

## 2023-05-28 DIAGNOSIS — D539 Nutritional anemia, unspecified: Secondary | ICD-10-CM

## 2023-05-28 NOTE — Progress Notes (Deleted)
[facility] Cancer Follow up Visit:  Patient Care Team: Buckley, [place] J, PA-C as PCP - General (Physician Assistant) Loni Muse, MD as Consulting Physician (Internal Medicine)  CHIEF COMPLAINTS/PURPOSE OF CONSULTATION:  HISTORY OF PRESENTING ILLNESS: Heather Goodman 22 y.o. female is here because of  anemia August 18, 2022: CT abdomen pelvis Possible thickening of left upper quadrant jejunal loops which may be secondary to enteritis of infectious or inflammatory etiology.  Polycystic kidneys. Small pericardial effusion.  August 18, 2022:  Folate 8 B12 109  September 09, 2022:   Stool for C. difficile positive.  Patient had not been on Abx prior to the diagnosis but had been undergoing CNA training  CT abdomen pelvis uterus and adnexa unremarkable.  No mass.  No evidence of Crohn's enteritis  October 10, 2022: WBC 5.9 hemoglobin 9.3 MCV 69 platelet count 374; 64 segs 27 lymphs 8 monos 2 eos 1 basophil Sed rate 28 CRP undetectable B12 169 ferritin 3  October 24 2022:  [facility] Hematology Consult  Patient is G0 P0 Menopause not reached.  Last period was June 04 2022, she is on Nexplanon.  Prior to being placed on contraception she had heavy menses which occurred monthly and lasted 1 week.  Periods were regular.  Did not usually have bleeding between periods.    Patient does not have history of uterine fibroids/uterine abnormalities.   Was placed on oral iron at 14 yrs of age but was not consistent with it.  Has received PRBC's on 2 occasions.  Has never received IV iron.  Develops constipation with oral iron.     Has a normal diet.  No history of hemorrhage following loss deciduous teeth.  Has hematochezia but does not pass mucus.  No history of intra-articular or soft tissue bleeding No history of abnormal bleeding in family members except for fibroids and heavy menses in female family members.  Patient has symptoms of fatigue, pallor,  DOE, decreased  performance status, intolerance to cold.  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  Tobacco none.  EtOH rare  [facility] Mother alive 24 DM Type II, fibroids, HTN Father alive 27 obesity Sisters alive 61 and 44 with polycystic kidney disease Brother alive 24 well  WBC 5.1 hemoglobin 10.1 MCV 72 platelet count 323; 71 segs 23 lymphs 5 monos 1 basophil Coombs test negative haptoglobin 96 Hemoglobin electrophoresis normal adult pattern Intrinsic factor blocking antibody 14.5 antiparietal cell antibody negative Folate 11.2  Fibrinogen 365 INR 1.0 PTT 31  October 24 2022:  Began Vitamin B12 replacement sq November 08 2022:  Feraheme 510 mg IV  November 14 2022:  Scheduled follow up for anemia.  Feels a bit better since receiving B12 and IV iron.  Pica has improved. Reviewed results of labs with patient.  Still having abdominal pain.   TSH 3.338 Free T40.71 Tissue transglutaminase antibody negative IgA 168  November 15 2022:  Feraheme 510 mg IV  December 08, 2022: EGD normal.  Colonoscopy demonstrated some grade 1 hemorrhoids  December 26, 2022:  Energy level good.  Reviewed results of labs with patient.  Not taking oral iron or B12.  Just began menses today.   Hgb 12.9 today.    March 13, 2023:  Scheduled follow-up for anemia.   Telephone visit.  When sitting or leaning on arms hands and feet fall asleep.  May benefit from MRI of neck  and T spine.  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  Taking oral iron and B12 with occasional constipation.    Review of Systems - Oncology  MEDICAL HISTORY: Past Medical History:  Diagnosis Date   Ovarian cyst    Polycystic kidney disease, childhood type (CPKD)     SURGICAL HISTORY: No past surgical history on file.  SOCIAL HISTORY: Social History   Socioeconomic History   Marital status: Single    Spouse name: Not on file   Number of children: 0   Years of  education: Not on file   Highest education level: Not on file  Occupational History   Occupation: student  Tobacco Use   Smoking status: Never   Smokeless tobacco: Never  Vaping Use   Vaping status: Never Used  Substance and Sexual Activity   Alcohol use: Never   Drug use: Never   Sexual activity: Yes    Birth control/protection: Implant  Other Topics Concern   Not on file  Social History Narrative   Not on file   Social Determinants of Health   Financial Resource Strain: Medium Risk (11/13/2020)   Received from [facility]   Overall Financial Resource Strain (CARDIA)    Difficulty of Paying Living Expenses: Somewhat hard  Food Insecurity: Food Insecurity Present (11/13/2020)   Received from [facility]   Hunger Vital Sign    Worried About Running Out of Food in the Last Year: Sometimes true    Ran Out of Food in the Last Year: Sometimes true  Transportation Needs: No Transportation Needs (11/13/2020)   Received from [facility]   PRAPARE - Transportation    Lack of Transportation (Medical): No    Lack of Transportation (Non-Medical): No  Physical Activity: Sufficiently Active (11/13/2020)   Received from [facility]   Exercise Vital Sign    Days of Exercise per Week: 7 days    Minutes of Exercise per Session: 60 min  Stress: Stress Concern Present (11/13/2020)   Received from [facility] of Occupational Health - Occupational Stress Questionnaire    Feeling of Stress : To some extent  Social Connections: Moderately Integrated (11/13/2020)   Received from [facility]   Social Connection and Isolation Panel [NHANES]    Frequency of Communication with Friends and Family: More than three times a week    Frequency of Social Gatherings with Friends and Family: Three times a week    Attends Religious Services: 1 to 4 times per year    Active Member of Clubs or  Organizations: Yes    Attends Banker Meetings: 1 to 4 times per year    Marital Status: Never married  Catering manager Violence: Not on file    FAMILY HISTORY Family History  Problem Relation Age of Onset   Kidney disease Mother    Kidney disease Sister    Crohn's disease Paternal Aunt    Stomach cancer Neg Hx    Colon cancer Neg Hx    Esophageal cancer Neg Hx     ALLERGIES:  is allergic to other, aspirin, bergera koenigii, and latex.  MEDICATIONS:  Current Outpatient Medications  Medication Sig Dispense Refill   acetaminophen (TYLENOL) 325 MG tablet Take 650 mg by mouth every 6 (six) hours as needed for moderate pain or headache.     azelaic acid (AZELEX) 20 % cream Apply topically daily. After  skin is thoroughly washed and patted dry, gently but thoroughly massage a thin film of azelaic acid cream into the affected area twice daily, in the morning and evening. 30 g 3   dicyclomine (BENTYL) 20 MG tablet Take 1 tablet (20 mg total) by mouth 2 (two) times daily. 20 tablet 0   hydrocortisone (ANUSOL-HC) 25 MG suppository      nifedipine 0.3 % ointment Place 1 Application rectally 4 (four) times daily. 30 g 0   ondansetron (ZOFRAN) 4 MG tablet Take 1 tablet (4 mg total) by mouth every 8 (eight) hours as needed for nausea or vomiting. 20 tablet 0   phentermine (ADIPEX-P) 37.5 MG tablet Take 37.5 mg by mouth every morning.     polyethylene glycol powder (GLYCOLAX/MIRALAX) 17 GM/SCOOP powder Take 255 g by mouth as directed. 255 g 0   tretinoin (RETIN-A) 0.025 % cream Apply topically at bedtime. Apply to face at bedtime 3 times per week as directed 45 g 3   tretinoin (RETIN-A) 0.025 % gel Apply topically at bedtime. 45 g 2   valACYclovir (VALTREX) 500 MG tablet Take 500 mg by mouth daily.     Vitamin D, Ergocalciferol, (DRISDOL) 1.25 MG (50000 UNIT) CAPS capsule Take 50,000 Units by mouth every Wednesday.     Current Facility-Administered Medications  Medication Dose Route  Frequency Provider Last Rate Last Admin   0.9 %  sodium chloride infusion  500 mL Intravenous Once Lynann Bologna, MD       0.9 %  sodium chloride infusion  500 mL Intravenous Once Lynann Bologna, MD        PHYSICAL EXAMINATION:  ECOG PERFORMANCE STATUS: 1 - Symptomatic but completely ambulatory   There were no vitals filed for this visit.   There were no vitals filed for this visit.    Physical Exam  As part of a telephone visit today a physical exam could not be conducted  LABORATORY DATA: I have personally reviewed the data as listed:  No visits with results within 1 Month(s) from this visit.  Latest known visit with results is:  Appointment on 12/26/2022  Component Date Value Ref Range Status   WBC Count 12/26/2022 5.7  4.0 - 10.5 K/uL Final   RBC 12/26/2022 4.69  3.87 - 5.11 MIL/uL Final   Hemoglobin 12/26/2022 12.9  12.0 - 15.0 g/dL Final   HCT 38/07/1750 38.6  36.0 - 46.0 % Final   MCV 12/26/2022 82.3  80.0 - 100.0 fL Final   MCH 12/26/2022 27.5  26.0 - 34.0 pg Final   MCHC 12/26/2022 33.4  30.0 - 36.0 g/dL Final   RDW 02/58/5277 20.9 (H)  11.5 - 15.5 % Final   Platelet Count 12/26/2022 170  150 - 400 K/uL Final   nRBC 12/26/2022 0.0  0.0 - 0.2 % Final   Neutrophils Relative % 12/26/2022 56  % Final   Neutro Abs 12/26/2022 3.2  1.7 - 7.7 K/uL Final   Lymphocytes Relative 12/26/2022 32  % Final   Lymphs Abs 12/26/2022 1.8  0.7 - 4.0 K/uL Final   Monocytes Relative 12/26/2022 7  % Final   Monocytes Absolute 12/26/2022 0.4  0.1 - 1.0 K/uL Final   Eosinophils Relative 12/26/2022 4  % Final   Eosinophils Absolute 12/26/2022 0.2  0.0 - 0.5 K/uL Final   Basophils Relative 12/26/2022 1  % Final   Basophils Absolute 12/26/2022 0.0  0.0 - 0.1 K/uL Final   Immature Granulocytes 12/26/2022 0  % Final  Abs Immature Granulocytes 12/26/2022 0.01  0.00 - 0.07 K/uL Final   Performed at [facility], [address], [place], [place] [address]     RADIOGRAPHIC STUDIES: I have personally reviewed the radiological images as listed and agree with the findings in the report  No results found.  ASSESSMENT/PLAN  Patient is a 22 year old female with symptomatic microcytic anemia who has a history of dysfunctional uterine bleeding.  Patient also has vitamin B12 deficiency and a recent history of C diff colitis  Anemia:  Due to iron deficiency owing to prior dysfunctional uterine bleeding exacerbated by B12 deficiency.    October 24 2022:  Began Vitamin B12 replacement sq November 08 2022:  Feraheme 510 mg IV November 15 2022: Feraheme 510 mg IV  December 26 2022-  Recommended patient begin Solgar gentle iron or Ashby Dawes made iron daily or every other day and vitamin B12 1000 micrograms sub lingual March 13 2023- Tolerating low dose oral iron.       Dysfunctional uterine bleeding:  This is characterized by menses that are prolonged, irregular as well as by heavy bleeding with passage of clots.   October 24 2022:  PLT count, PT, PTT, Fibrinogen, von Willebrand screen normal.  on Nexplanon per gynecology with control of menses  Vitamin B12 deficiency:   October 24 2022:  Intrinsic factor blocking Ab positive.  Antiparietal cell Ab negative Began parenteral B12 replacement.    April 12 2023- Taking sublingual B12   C diff colitis:  Likely acquired via occupational exposure.  Management per GI  Hematochezia  December 08 2022- EGD normal. Colonoscopy demonstrated some grade 1 hemorrhoids   Numbness of hands and feet   March 13 2023- May benefit from MRI cervical and thoracic spine.  Encouraged patient to discuss this with PCP  Follow up  March 13 2023- Recommended in person follow up for next visit so that labs could be drawn    Cancer Staging  No matching staging information was found for the patient.    No problem-specific Assessment & Plan notes found for this encounter.   No orders of the defined types were placed in this  encounter.  23  minutes was spent in patient care.  This included time spent preparing to see the patient (e.g., review of tests), obtaining and/or reviewing separately obtained history, counseling and educating the patient/family/caregiver, ordering medications, tests, or procedures; documenting clinical information in the electronic or other health record, independently interpreting results and communicating results to the patient/family/caregiver as well as coordination of care.       All questions were answered. The patient knows to call the clinic with any problems, questions or concerns.  This note was electronically signed.    Loni Muse, MD  05/28/2023 4:16 PM

## 2023-05-29 ENCOUNTER — Inpatient Hospital Stay: Payer: BC Managed Care – PPO | Attending: Oncology | Admitting: Oncology

## 2023-08-03 ENCOUNTER — Encounter (HOSPITAL_COMMUNITY): Payer: Self-pay

## 2023-08-03 ENCOUNTER — Emergency Department (HOSPITAL_COMMUNITY): Payer: BC Managed Care – PPO

## 2023-08-03 ENCOUNTER — Other Ambulatory Visit: Payer: Self-pay

## 2023-08-03 ENCOUNTER — Emergency Department (HOSPITAL_COMMUNITY)
Admission: EM | Admit: 2023-08-03 | Discharge: 2023-08-03 | Disposition: A | Discharge status: Home / Self Care | Payer: BC Managed Care – PPO | Attending: Emergency Medicine | Admitting: Emergency Medicine

## 2023-08-03 DIAGNOSIS — R1031 Right lower quadrant pain: Secondary | ICD-10-CM | POA: Diagnosis present

## 2023-08-03 DIAGNOSIS — N941 Unspecified dyspareunia: Secondary | ICD-10-CM | POA: Insufficient documentation

## 2023-08-03 DIAGNOSIS — Z91040 Latex allergy status: Secondary | ICD-10-CM | POA: Insufficient documentation

## 2023-08-03 LAB — COMPREHENSIVE METABOLIC PANEL
ALT: 14 U/L (ref 0–44)
AST: 14 U/L — ABNORMAL LOW (ref 15–41)
Albumin: 3.8 g/dL (ref 3.5–5.0)
Alkaline Phosphatase: 42 U/L (ref 38–126)
Anion gap: 6 (ref 5–15)
BUN: 5 mg/dL — ABNORMAL LOW (ref 6–20)
CO2: 20 mmol/L — ABNORMAL LOW (ref 22–32)
Calcium: 8.4 mg/dL — ABNORMAL LOW (ref 8.9–10.3)
Chloride: 108 mmol/L (ref 98–111)
Creatinine, Ser: 0.72 mg/dL (ref 0.44–1.00)
GFR, Estimated: >60 mL/min (ref >60)
Glucose, Bld: 85 mg/dL (ref 70–99)
Potassium: 3.8 mmol/L (ref 3.5–5.1)
Sodium: 134 mmol/L — ABNORMAL LOW (ref 135–145)
Total Bilirubin: 0.3 mg/dL (ref 0.3–1.2)
Total Protein: 7.1 g/dL (ref 6.5–8.1)

## 2023-08-03 LAB — URINALYSIS, ROUTINE W REFLEX MICROSCOPIC
Bilirubin Urine: NEGATIVE
Glucose, UA: NEGATIVE mg/dL
Hgb urine dipstick: NEGATIVE
Ketones, ur: NEGATIVE mg/dL
Leukocytes,Ua: NEGATIVE
Nitrite: NEGATIVE
Protein, ur: NEGATIVE mg/dL
Specific Gravity, Urine: 1.009 (ref 1.005–1.030)
pH: 6 (ref 5.0–8.0)

## 2023-08-03 LAB — CBC
HCT: 38.7 % (ref 36.0–46.0)
Hemoglobin: 12.9 g/dL (ref 12.0–15.0)
MCH: 29.3 pg (ref 26.0–34.0)
MCHC: 33.3 g/dL (ref 30.0–36.0)
MCV: 88 fL (ref 80.0–100.0)
Platelets: 294 10*3/uL (ref 150–400)
RBC: 4.4 MIL/uL (ref 3.87–5.11)
RDW: 12.5 % (ref 11.5–15.5)
WBC: 6.6 10*3/uL (ref 4.0–10.5)
nRBC: 0 % (ref 0.0–0.2)

## 2023-08-03 LAB — LIPASE, BLOOD: Lipase: 21 U/L (ref 11–51)

## 2023-08-03 LAB — HCG, SERUM, QUALITATIVE: Preg, Serum: NEGATIVE

## 2023-08-03 MED ORDER — ONDANSETRON HCL 4 MG/2ML IJ SOLN
4.0000 mg | Freq: Once | INTRAMUSCULAR | Status: AC
  Administered 2023-08-03: 4 mg via INTRAVENOUS
  Filled 2023-08-03: qty 2

## 2023-08-03 MED ORDER — MORPHINE SULFATE (PF) 2 MG/ML IV SOLN
2.0000 mg | Freq: Once | INTRAVENOUS | Status: AC
  Administered 2023-08-03: 2 mg via INTRAVENOUS
  Filled 2023-08-03: qty 1

## 2023-08-03 MED ORDER — MORPHINE SULFATE (PF) 4 MG/ML IV SOLN
4.0000 mg | Freq: Once | INTRAVENOUS | Status: AC
  Administered 2023-08-03: 4 mg via INTRAVENOUS
  Filled 2023-08-03: qty 1

## 2023-08-03 MED ORDER — IOHEXOL 350 MG/ML SOLN
75.0000 mL | Freq: Once | INTRAVENOUS | Status: AC | PRN
  Administered 2023-08-03: 75 mL via INTRAVENOUS

## 2023-08-03 MED ORDER — HYDROMORPHONE HCL 1 MG/ML IJ SOLN
1.0000 mg | Freq: Once | INTRAMUSCULAR | Status: AC
  Administered 2023-08-03: 1 mg via INTRAVENOUS
  Filled 2023-08-03: qty 1

## 2023-08-03 NOTE — ED Triage Notes (Signed)
Pt c/o lower abdominal, lower back, vaginal, and anal pain after having sexual intercourse. Pt denies discharge or bleeding. Pt's last BM earlier today. Pt has nausea, denies vomiting.

## 2023-08-03 NOTE — ED Provider Notes (Signed)
[place] EMERGENCY DEPARTMENT AT [facility] Provider Note   CSN: [id number] Arrival date & time: 08/03/23  1243     History {Add pertinent medical, surgical, social history, OB history to HPI:1} Chief Complaint  Patient presents with   Abdominal Pain   HPI Heather Goodman is a 22 y.o. female presenting for abdominal pain.  States she was having vaginal sex about an hour and a half ago.  A few minutes after having sex, start develop pain in her right lower quadrant of her abdomen.  It is a sharp pain that is nonradiating.  Endorses a history of ovarian cyst.  Endorses some nausea but no vomiting diarrhea.  Denies abnormal vaginal bleeding or discharge.  Denies urinary symptoms.   Abdominal Pain      Home Medications Prior to Admission medications   Medication Sig Start Date End Date Taking? Authorizing Provider  acetaminophen (TYLENOL) 325 MG tablet Take 650 mg by mouth every 6 (six) hours as needed for moderate pain or headache. 01/05/15   [provider]  azelaic acid (AZELEX) 20 % cream Apply topically daily. After skin is thoroughly washed and patted dry, gently but thoroughly massage a thin film of azelaic acid cream into the affected area twice daily, in the morning and evening. 12/22/22   Terri Piedra, DO  dicyclomine (BENTYL) 20 MG tablet Take 1 tablet (20 mg total) by mouth 2 (two) times daily. 08/18/22   Jacalyn Lefevre, MD  hydrocortisone (ANUSOL-HC) 25 MG suppository  06/02/22   [provider]  nifedipine 0.3 % ointment Place 1 Application rectally 4 (four) times daily. 08/18/22   Jacalyn Lefevre, MD  ondansetron (ZOFRAN) 4 MG tablet Take 1 tablet (4 mg total) by mouth every 8 (eight) hours as needed for nausea or vomiting. 08/18/22   Jacalyn Lefevre, MD  phentermine (ADIPEX-P) 37.5 MG tablet Take 37.5 mg by mouth every morning. 10/08/22   [provider]  polyethylene glycol powder (GLYCOLAX/MIRALAX) 17 GM/SCOOP powder Take  255 g by mouth as directed. 09/30/22   Lynann Bologna, MD  tretinoin (RETIN-A) 0.025 % cream Apply topically at bedtime. Apply to face at bedtime 3 times per week as directed 12/22/22 12/22/23  Terri Piedra, DO  tretinoin (RETIN-A) 0.025 % gel Apply topically at bedtime. 12/23/22 12/23/23  Terri Piedra, DO  valACYclovir (VALTREX) 500 MG tablet Take 500 mg by mouth daily. 08/05/19   [provider]  Vitamin D, Ergocalciferol, (DRISDOL) 1.25 MG (50000 UNIT) CAPS capsule Take 50,000 Units by mouth every Wednesday. 09/02/20   [provider]      Allergies    Other, Aspirin, Bergera koenigii, and Latex    Review of Systems   Review of Systems  Gastrointestinal:  Positive for abdominal pain.    Physical Exam Updated Vital Signs BP (!) 158/97 (BP Location: Right Arm)   Pulse 82   Temp 98 F (36.7 C) (Oral)   Resp 16   Ht 5\' 7"  (1.702 m)   Wt 106.6 kg   SpO2 92%   BMI 36.81 kg/m  Physical Exam  ED Results / Procedures / Treatments   Labs (all labs ordered are listed, but only abnormal results are displayed) Labs Reviewed  LIPASE, BLOOD  COMPREHENSIVE METABOLIC PANEL  CBC  URINALYSIS, ROUTINE W REFLEX MICROSCOPIC  HCG, SERUM, QUALITATIVE    EKG None  Radiology No results found.  Procedures Procedures  {Document cardiac monitor, telemetry assessment procedure when appropriate:1}  Medications Ordered in ED  Medications - No data to display  ED Course/ Medical Decision Making/ A&P   {   Click here for ABCD2, HEART and other calculatorsREFRESH Note before signing :1}                              Medical Decision Making Amount and/or Complexity of Data Reviewed Labs: ordered.   ***  {Document critical care time when appropriate:1} {Document review of labs and clinical decision tools ie heart score, Chads2Vasc2 etc:1}  {Document your independent review of radiology images, and any outside records:1} {Document your discussion with family  members, caretakers, and with consultants:1} {Document social determinants of health affecting pt's care:1} {Document your decision making why or why not admission, treatments were needed:1} Final Clinical Impression(s) / ED Diagnoses Final diagnoses:  None    Rx / DC Orders ED Discharge Orders     None

## 2023-08-03 NOTE — ED Provider Notes (Signed)
Patient is a handoff from [place], [place].  Plan at time of handoff is to await results of ultrasound and CT abdomen pelvis for possible explanation of current symptoms.  Unclear exact etiology as patient denies any concerns for STIs and is only sexually active with 1 female partner.  Pain brought on after sexual intercourse. Physical Exam  BP 117/67   Pulse (!) 58   Temp 98.6 F (37 C) (Oral)   Resp 16   Ht 5\' 7"  (1.702 m)   Wt 106.6 kg   SpO2 100%   BMI 36.81 kg/m   Physical Exam  Procedures  Procedures  ED Course / MDM   Clinical Course as of 08/03/23 2232  Mon Aug 03, 2023  1513 Post-coital pain. RLQ pain. Pelvic unremarkable. Pending US/CTAP.  [OZ]    Clinical Course User Index [OZ] Smitty Knudsen, PA-C   Medical Decision Making Amount and/or Complexity of Data Reviewed Labs: ordered. Radiology: ordered.  Risk Prescription drug management.   Patient is a handoff from [place], [place].  Please see his note for full HPI and physical exam findings.  Plan at time of handoff is to await results of ultrasound and CT abdomen pelvis for possible explanation of current symptoms.  Ultrasound results initially returned back without any abnormal findings to explain current pain.  No evidence of torsion or other gynecologic abnormality.  CT abdomen pelvis pending at this time.  Patient redosed with morphine 2 mg for continued pain.  About 2 hours later, patient still endorsing some pain so switched to Dilaudid 1 mg.  CT abdomen pelvis imaging is also reassuring without any acute abnormalities noted to explain current symptoms.  Given that this is centered around sexual intercourse, suspect possible developing dyspareunia but advised patient to follow-up with gynecology for further assessment given no acute cause of symptoms identified today requiring admission or further workup.  Patient is in agreement with current plan verbalized understanding return precautions.  Patient discharged  home in stable condition.      Smitty Knudsen, PA-C 08/03/23 2232    Royanne Foots, DO 08/06/23 [phone]

## 2023-08-03 NOTE — ED Notes (Signed)
Patient transported to CT 

## 2023-08-03 NOTE — ED Notes (Signed)
This paramedic called CT and they state pt should be going to them soon.Heather KitchenMarland Goodman

## 2023-08-03 NOTE — Discharge Instructions (Signed)
You were seen in the emergency department today with concerns of abdominal pain.  Your labs, imaging, and physical exam were all reassuring.  I am unsure exactly what is causing her pain at this time but appears to not be anything that requires immediate intervention.  I would advise that you follow-up with your primary care provider or your OB/GYN for further assessment to ensure that your symptoms are improving.  In the meantime, I would refrain from sexual activity until your symptoms resolved.  It could be helpful to keep a log to determine if there are any other types of activity that may be causing this pain to develop.  If you feel that symptoms are changing or worsening, return to the emergency department.

## 2023-08-03 NOTE — ED Notes (Signed)
EDPA at bedisde

## 2023-08-03 NOTE — ED Notes (Signed)
Patient transported to US 

## 2023-09-14 ENCOUNTER — Encounter: Payer: Self-pay | Admitting: Oncology
